# Patient Record
Sex: Female | Born: 1990 | Race: White | Hispanic: No | Marital: Single | Smoking: Never smoker
Health system: Southern US, Community
[De-identification: ages and names within clinical notes are randomized; demographics above are authoritative.]

## PROBLEM LIST (undated history)

## (undated) ENCOUNTER — Inpatient Hospital Stay: Payer: Self-pay

## (undated) DIAGNOSIS — N912 Amenorrhea, unspecified: Secondary | ICD-10-CM

## (undated) DIAGNOSIS — Z789 Other specified health status: Secondary | ICD-10-CM

## (undated) DIAGNOSIS — J45909 Unspecified asthma, uncomplicated: Secondary | ICD-10-CM

## (undated) HISTORY — DX: Amenorrhea, unspecified: N91.2

## (undated) HISTORY — PX: NO PAST SURGERIES: SHX2092

## (undated) HISTORY — DX: Unspecified asthma, uncomplicated: J45.909

---
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
## 2012-03-06 ENCOUNTER — Emergency Department: Payer: Self-pay | Admitting: Emergency Medicine

## 2012-03-23 ENCOUNTER — Emergency Department: Payer: Self-pay | Admitting: Emergency Medicine

## 2012-03-23 LAB — COMPREHENSIVE METABOLIC PANEL
Alkaline Phosphatase: 56 U/L — ABNORMAL LOW (ref 82–169)
Anion Gap: 12 (ref 7–16)
BUN: 9 mg/dL (ref 9–21)
Bilirubin,Total: 0.3 mg/dL (ref 0.2–1.0)
Calcium, Total: 9 mg/dL (ref 9.0–10.7)
Co2: 24 mmol/L (ref 16–25)
Creatinine: 0.53 mg/dL — ABNORMAL LOW (ref 0.60–1.30)
Osmolality: 289 (ref 275–301)
SGPT (ALT): 18 U/L
Sodium: 146 mmol/L — ABNORMAL HIGH (ref 132–141)

## 2012-03-23 LAB — CBC
HGB: 13.7 g/dL (ref 12.0–16.0)
MCH: 29.2 pg (ref 26.0–34.0)
MCHC: 33.8 g/dL (ref 32.0–36.0)
MCV: 86 fL (ref 80–100)
RBC: 4.7 10*6/uL (ref 3.80–5.20)
RDW: 12.6 % (ref 11.5–14.5)

## 2012-03-23 LAB — URINALYSIS, COMPLETE
Bilirubin,UR: NEGATIVE
Glucose,UR: NEGATIVE mg/dL (ref 0–75)
Ketone: NEGATIVE
Nitrite: NEGATIVE
Ph: 6 (ref 4.5–8.0)
Protein: 30
Squamous Epithelial: 4
WBC UR: 12 /HPF (ref 0–5)

## 2012-05-02 ENCOUNTER — Emergency Department: Payer: Self-pay | Admitting: *Deleted

## 2012-05-03 LAB — URINALYSIS, COMPLETE
Bacteria: NONE SEEN
Blood: NEGATIVE
Protein: 30
RBC,UR: 10 /HPF (ref 0–5)
Specific Gravity: 1.03 (ref 1.003–1.030)
Squamous Epithelial: 3
WBC UR: 28 /HPF (ref 0–5)

## 2012-05-03 LAB — PREGNANCY, URINE: Pregnancy Test, Urine: NEGATIVE m[IU]/mL

## 2012-05-24 ENCOUNTER — Ambulatory Visit: Payer: Self-pay | Admitting: Pediatrics

## 2013-03-25 ENCOUNTER — Emergency Department: Payer: Self-pay | Admitting: Emergency Medicine

## 2013-03-25 LAB — URINALYSIS, COMPLETE
Bilirubin,UR: NEGATIVE
Glucose,UR: NEGATIVE mg/dL (ref 0–75)
Protein: 30
Specific Gravity: 1.024 (ref 1.003–1.030)
WBC UR: 43 /HPF (ref 0–5)

## 2015-09-03 ENCOUNTER — Other Ambulatory Visit: Payer: Self-pay | Admitting: Family Medicine

## 2015-09-10 ENCOUNTER — Other Ambulatory Visit: Payer: Self-pay | Admitting: Family Medicine

## 2015-09-13 ENCOUNTER — Other Ambulatory Visit: Payer: Self-pay | Admitting: Family Medicine

## 2015-09-15 NOTE — Telephone Encounter (Signed)
Please contact patient and let her know that she has not been seen over 1 year and therefore I can not refill any medications for her until she follows up.

## 2015-09-15 NOTE — Telephone Encounter (Signed)
A message was left for this patient stating that no meds could be refilled until she comes in for a office visit since it has been over a year.

## 2015-10-14 LAB — HM PAP SMEAR: HM PAP: NORMAL

## 2015-11-27 ENCOUNTER — Encounter: Payer: Self-pay | Admitting: Emergency Medicine

## 2015-11-27 ENCOUNTER — Emergency Department
Admission: EM | Admit: 2015-11-27 | Discharge: 2015-11-27 | Disposition: A | Discharge status: Home / Self Care | Payer: Self-pay | Attending: Emergency Medicine | Admitting: Emergency Medicine

## 2015-11-27 DIAGNOSIS — Z3202 Encounter for pregnancy test, result negative: Secondary | ICD-10-CM | POA: Insufficient documentation

## 2015-11-27 DIAGNOSIS — N12 Tubulo-interstitial nephritis, not specified as acute or chronic: Secondary | ICD-10-CM | POA: Insufficient documentation

## 2015-11-27 LAB — CBC
HCT: 37.4 % (ref 35.0–47.0)
Hemoglobin: 12.2 g/dL (ref 12.0–16.0)
MCH: 28.8 pg (ref 26.0–34.0)
MCHC: 32.8 g/dL (ref 32.0–36.0)
MCV: 87.8 fL (ref 80.0–100.0)
PLATELETS: 207 10*3/uL (ref 150–440)
RBC: 4.26 MIL/uL (ref 3.80–5.20)
RDW: 12.2 % (ref 11.5–14.5)
WBC: 16.5 10*3/uL — ABNORMAL HIGH (ref 3.6–11.0)

## 2015-11-27 LAB — COMPREHENSIVE METABOLIC PANEL WITH GFR
ALT: 13 U/L — ABNORMAL LOW (ref 14–54)
AST: 12 U/L — ABNORMAL LOW (ref 15–41)
Albumin: 3.7 g/dL (ref 3.5–5.0)
Alkaline Phosphatase: 45 U/L (ref 38–126)
Anion gap: 8 (ref 5–15)
BUN: 11 mg/dL (ref 6–20)
CO2: 23 mmol/L (ref 22–32)
Calcium: 8.9 mg/dL (ref 8.9–10.3)
Chloride: 103 mmol/L (ref 101–111)
Creatinine, Ser: 0.53 mg/dL (ref 0.44–1.00)
GFR calc Af Amer: >60 mL/min
GFR calc non Af Amer: >60 mL/min
Glucose, Bld: 95 mg/dL (ref 65–99)
Potassium: 3.6 mmol/L (ref 3.5–5.1)
Sodium: 134 mmol/L — ABNORMAL LOW (ref 135–145)
Total Bilirubin: 0.8 mg/dL (ref 0.3–1.2)
Total Protein: 7 g/dL (ref 6.5–8.1)

## 2015-11-27 LAB — LIPASE, BLOOD: LIPASE: 19 U/L (ref 11–51)

## 2015-11-27 LAB — POCT PREGNANCY, URINE: Preg Test, Ur: NEGATIVE

## 2015-11-27 LAB — URINALYSIS COMPLETE WITH MICROSCOPIC (ARMC ONLY)
BILIRUBIN URINE: NEGATIVE
Glucose, UA: NEGATIVE mg/dL
KETONES UR: NEGATIVE mg/dL
Nitrite: NEGATIVE
Protein, ur: NEGATIVE mg/dL
Specific Gravity, Urine: 1.017 (ref 1.005–1.030)
pH: 5 (ref 5.0–8.0)

## 2015-11-27 MED ORDER — MORPHINE SULFATE (PF) 4 MG/ML IV SOLN
4.0000 mg | Freq: Once | INTRAVENOUS | Status: AC
  Administered 2015-11-27: 4 mg via INTRAVENOUS
  Filled 2015-11-27: qty 1

## 2015-11-27 MED ORDER — HYDROCODONE-ACETAMINOPHEN 5-325 MG PO TABS: 1.0000 | ORAL_TABLET | ORAL | Status: DC | PRN

## 2015-11-27 MED ORDER — PHENAZOPYRIDINE HCL 200 MG PO TABS: 200.0000 mg | ORAL_TABLET | Freq: Three times a day (TID) | ORAL | Status: AC | PRN

## 2015-11-27 MED ORDER — SODIUM CHLORIDE 0.9 % IV BOLUS (SEPSIS)
1000.0000 mL | Freq: Once | INTRAVENOUS | Status: AC
  Administered 2015-11-27: 1000 mL via INTRAVENOUS

## 2015-11-27 MED ORDER — LEVOFLOXACIN 500 MG PO TABS: 500.0000 mg | ORAL_TABLET | Freq: Every day | ORAL | Status: AC

## 2015-11-27 MED ORDER — ONDANSETRON HCL 4 MG/2ML IJ SOLN
4.0000 mg | Freq: Once | INTRAMUSCULAR | Status: AC
  Administered 2015-11-27: 4 mg via INTRAVENOUS
  Filled 2015-11-27: qty 2

## 2015-11-27 MED ORDER — DEXTROSE 5 % IV SOLN
1.0000 g | Freq: Once | INTRAVENOUS | Status: AC
  Administered 2015-11-27: 1 g via INTRAVENOUS
  Filled 2015-11-27: qty 10

## 2015-11-27 NOTE — Discharge Instructions (Signed)
Please take your prescribed antibiotics, pain medication as needed, and follow up with her primary care doctor in 2-3 days for recheck. Return to the emergency department if you continue to have fever, develop worsening abdominal pain, begin vomiting unable to keep down her antibiotics, or any other symptom personally concerning to yourself.    Pyelonephritis, Adult Pyelonephritis is a kidney infection. The kidneys are the organs that filter a person's blood and move waste out of the bloodstream and into the urine. Urine passes from the kidneys, through the ureters, and into the bladder. There are two main types of pyelonephritis:  Infections that come on quickly without any warning (acute pyelonephritis).  Infections that last for a long period of time (chronic pyelonephritis). In most cases, the infection clears up with treatment and does not cause further problems. More severe infections or chronic infections can sometimes spread to the bloodstream or lead to other problems with the kidneys. CAUSES This condition is usually caused by:  Bacteria traveling from the bladder to the kidney through infected urine. The urine in the bladder can become infected with bacteria from:  Bladder infection (cystitis).  Inflammation of the prostate gland (prostatitis).  Sexual intercourse, in females.  Bacteria traveling from the bloodstream to the kidney. RISK FACTORS This condition is more likely to develop in:  Pregnant women.  Older people.  People who have diabetes.  People who have kidney stones or bladder stones.  People who have other abnormalities of the kidney or ureter.  People who have a catheter placed in the bladder.  People who have cancer.  People who are sexually active.  Women who use spermicides.  People who have had a prior urinary tract infection. SYMPTOMS Symptoms of this condition include:  Frequent urination.  Strong or persistent urge to  urinate.  Burning or stinging when urinating.  Abdominal pain.  Back pain.  Pain in the side or flank area.  Fever.  Chills.  Blood in the urine, or dark urine.  Nausea.  Vomiting. DIAGNOSIS This condition may be diagnosed based on:  Medical history and physical exam.  Urine tests.  Blood tests. You may also have imaging tests of the kidneys, such as an ultrasound or CT scan. TREATMENT Treatment for this condition may depend on the severity of the infection.  If the infection is mild and is found early, you may be treated with antibiotic medicines taken by mouth. You will need to drink fluids to remain hydrated.  If the infection is more severe, you may need to stay in the hospital and receive antibiotics given directly into a vein through an IV tube. You may also need to receive fluids through an IV tube if you are not able to remain hydrated. After your hospital stay, you may need to take oral antibiotics for a period of time. Other treatments may be required, depending on the cause of the infection. HOME CARE INSTRUCTIONS Medicines  Take over-the-counter and prescription medicines only as told by your health care provider.  If you were prescribed an antibiotic medicine, take it as told by your health care provider. Do not stop taking the antibiotic even if you start to feel better. General Instructions  Drink enough fluid to keep your urine clear or pale yellow.  Avoid caffeine, tea, and carbonated beverages. They tend to irritate the bladder.  Urinate often. Avoid holding in urine for long periods of time.  Urinate before and after sex.  After a bowel movement, women should cleanse from  front to back. Use each tissue only once.  Keep all follow-up visits as told by your health care provider. This is important. SEEK MEDICAL CARE IF:  Your symptoms do not get better after 2 days of treatment.  Your symptoms get worse.  You have a fever. SEEK IMMEDIATE  MEDICAL CARE IF:  You are unable to take your antibiotics or fluids.  You have shaking chills.  You vomit.  You have severe flank or back pain.  You have extreme weakness or fainting.   This information is not intended to replace advice given to you by your health care provider. Make sure you discuss any questions you have with your health care provider.   Document Released: 11/29/2005 Document Revised: 08/20/2015 Document Reviewed: 03/24/2015 Elsevier Interactive Patient Education [employer]2016 [employer]

## 2015-11-27 NOTE — ED Provider Notes (Signed)
[facility] Emergency Department Provider Note  Time seen: 4:09 PM  I have reviewed the triage vital signs and the nursing notes.   HISTORY  Chief Complaint Flank Pain    HPI Unknown Lauren Mercer is a 24 y.o. female with no past medical history who presents the emergency department right flank pain. According to the patient for the past 2 days she has been experiencing right flank pain. Denies any fever, nausea, vomiting, diarrhea, dysuria, has noted some mild urinary frequency. Last menstrual period was approximately 2 weeks ago. States low-grade fevers, subjective. Describes her pain as an 8/10 currently, aching pain in the right flank.     History reviewed. No pertinent past medical history.  There are no active problems to display for this patient.   History reviewed. No pertinent past surgical history.  No current outpatient prescriptions on file.  Allergies Review of patient's allergies indicates no known allergies.  No family history on file.  Social History Social History  Substance Use Topics  . Smoking status: Never Smoker   . Smokeless tobacco: None  . Alcohol Use: No    Review of Systems Constitutional: Negative for fever. Cardiovascular: Negative for chest pain. Respiratory: Negative for shortness of breath. Gastrointestinal: Positive right flank pain.  Genitourinary: Negative for dysuria. Positive urinary frequency. Neurological: Negative for headache 10-point ROS otherwise negative.  ____________________________________________   PHYSICAL EXAM:  VITAL SIGNS: ED Triage Vitals  Enc Vitals Group     BP 11/27/15 1558 128/70 mmHg     Pulse Rate 11/27/15 1558 57     Resp 11/27/15 1558 18     Temp 11/27/15 1558 99.2 F (37.3 C)     Temp Source 11/27/15 1558 Oral     SpO2 11/27/15 1558 100 %     Weight 11/27/15 1558 155 lb (70.308 kg)     Height 11/27/15 1558  (1.626 m)     Head Cir --      Peak Flow --      Pain  Score 11/27/15 1555 10     Pain Loc --      Pain Edu? --      Excl. in GC? --     Constitutional: Alert and oriented. Well appearing and in no distress. Eyes: Normal exam ENT   Head: Normocephalic and atraumatic.   Mouth/Throat: Mucous membranes are moist. Cardiovascular: Normal rate, regular rhythm. No murmur Respiratory: Normal respiratory effort without tachypnea nor retractions. Breath sounds are clear Gastrointestinal: Soft, moderate right-sided abdominal tenderness palpation, upper and lower. No rebound or guarding. No distention. No CVA tenderness. Musculoskeletal: Nontender with normal range of motion in all extremities.  Neurologic:  Normal speech and language. No gross focal neurologic deficits  Skin:  Skin is warm, dry and intact.  Psychiatric: Mood and affect are normal. Speech and behavior are normal.   ____________________________________________      INITIAL IMPRESSION / ASSESSMENT AND PLAN / ED COURSE  Pertinent labs & imaging results that were available during my care of the patient were reviewed by me and considered in my medical decision making (see chart for details).  Patient with right flank/right side abdominal pain. Moderate tenderness to palpation over the same area. We will check labs, treat pain and nausea, IV hydrate while awaiting lab results. Overall well-appearing.  Labs that most consistent with pyelonephritis which fits her exam findings. We will treat with Rocephin in the emergency department, discharge and pain medication and antibiotics. Patient is agreeable to plan.  ____________________________________________   FINAL CLINICAL IMPRESSION(S) / ED DIAGNOSES  Right-sided abdominal pain Pyelonephritis  Minna AntisKevin Quitman Norberto, MD 11/27/15 [phone]

## 2015-11-27 NOTE — ED Notes (Signed)
C/o right flank pain that radiates to RUQ area since Tuesday, denies any urinary symptoms, has had some nausea

## 2015-11-28 ENCOUNTER — Other Ambulatory Visit: Payer: Self-pay | Admitting: Family Medicine

## 2015-11-30 LAB — URINE CULTURE: Culture: >=100000

## 2016-11-30 ENCOUNTER — Encounter: Payer: Self-pay | Admitting: Certified Nurse Midwife

## 2016-11-30 ENCOUNTER — Ambulatory Visit (INDEPENDENT_AMBULATORY_CARE_PROVIDER_SITE_OTHER): Payer: Medicaid Other | Admitting: Certified Nurse Midwife

## 2016-11-30 VITALS — BP 105/67 | HR 73 | Wt 164.5 lb

## 2016-11-30 DIAGNOSIS — Z3401 Encounter for supervision of normal first pregnancy, first trimester: Secondary | ICD-10-CM

## 2016-11-30 DIAGNOSIS — L309 Dermatitis, unspecified: Secondary | ICD-10-CM

## 2016-11-30 DIAGNOSIS — Z1389 Encounter for screening for other disorder: Secondary | ICD-10-CM

## 2016-11-30 DIAGNOSIS — N926 Irregular menstruation, unspecified: Secondary | ICD-10-CM

## 2016-11-30 DIAGNOSIS — Z113 Encounter for screening for infections with a predominantly sexual mode of transmission: Secondary | ICD-10-CM | POA: Diagnosis not present

## 2016-11-30 DIAGNOSIS — N912 Amenorrhea, unspecified: Secondary | ICD-10-CM

## 2016-11-30 DIAGNOSIS — G43001 Migraine without aura, not intractable, with status migrainosus: Secondary | ICD-10-CM

## 2016-11-30 DIAGNOSIS — J4520 Mild intermittent asthma, uncomplicated: Secondary | ICD-10-CM

## 2016-11-30 DIAGNOSIS — Z369 Encounter for antenatal screening, unspecified: Secondary | ICD-10-CM

## 2016-11-30 HISTORY — DX: Amenorrhea, unspecified: N91.2

## 2016-11-30 NOTE — Progress Notes (Addendum)
NEW OB HISTORY AND PHYSICAL  SUBJECTIVE:       Lauren Mercer is a 25 y.o. G1P0 female, Patient's last menstrual period was 08/23/2016 (approximate)., Estimated Date of Delivery: 05/30/17, [id number], presents today for establishment of Prenatal Care. She complains of nausea without vomiting and breast tenderness. Pregnancy was unplanned, but both pt and her partner are excited. Reports history of childhood asthma, last use of inhaler in "first grade".      Gynecologic History Patient's last menstrual period was 08/23/2016 (approximate). Abnormal; LMP date from HD based on last single period, but had two cycles in October 2017. Contraception: OCP (estrogen/progesterone) Last Pap: October 2016. Results were: normal  Obstetric History OB History  Gravida Para Term Preterm AB Living  1            SAB TAB Ectopic Multiple Live Births               # Outcome Date GA Lbr Len/2nd Weight Sex Delivery Anes PTL Lv  1 Current               Past Medical History:  Diagnosis Date  . Amenorrhea 11/30/2016  . Asthma     History reviewed. No pertinent surgical history.  No current outpatient prescriptions on file prior to visit.   No current facility-administered medications on file prior to visit.     No Known Allergies  Social History   Social History  . Marital status: Single    Spouse name: N/A  . Number of children: N/A  . Years of education: N/A   Occupational History  . Not on file.   Social History Main Topics  . Smoking status: Never Smoker  . Smokeless tobacco: Never Used  . Alcohol use No  . Drug use: No  . Sexual activity: Yes    Partners: Male    Birth control/ protection: None   Other Topics Concern  . Not on file   Social History Narrative  . No narrative on file    Family History  Problem Relation Age of Onset  . Diabetes Maternal Uncle     The following portions of the patient's history were reviewed and updated as appropriate: allergies, current  medications, past OB history, past medical history, past surgical history, past family history, past social history, and problem list.    OBJECTIVE: Initial Physical Exam (New OB)  GENERAL APPEARANCE: alert, well appearing, oriented to person, place and time HEAD: normocephalic, atraumatic MOUTH: dental hygiene good THYROID: no thyromegaly or masses present BREASTS: no masses noted, no significant tenderness, no palpable axillary nodes, no skin changes LUNGS: clear to auscultation, no wheezes, rales or rhonchi, symmetric air entry HEART: regular rate and rhythm, no murmurs ABDOMEN: soft, nontender, nondistended, no abnormal masses, no epigastric pain EXTREMITIES: no redness or tenderness in the calves or thighs SKIN: normal coloration and turgor, no rashes, seven professional tattoos  LYMPH NODES: not assessed NEUROLOGIC: alert, oriented, normal speech, no focal findings or movement disorder noted  PELVIC EXAM EXTERNAL GENITALIA: normal appearing vulva with no masses, tenderness or lesions UTERUS: S<D ADNEXA: no masses palpable and nontender  ASSESSMENT: Amenorrhea   PLAN: New OB visit converted to GYN visit [facility] US Labs See orders

## 2016-11-30 NOTE — Patient Instructions (Addendum)
Pregnancy and Zika Virus Disease Introduction Zika virus disease, or Zika, is an illness that can spread to people from mosquitoes that carry the virus. It may also spread from person to person through infected body fluids. Zika first occurred in [place], but recently it has spread to new areas. The virus occurs in tropical climates. The location of Zika continues to change. Most people who become infected with Zika virus do not develop serious illness. However, Zika may cause birth defects in an unborn baby whose mother is infected with the virus. It may also increase the risk of miscarriage. What are the symptoms of Zika virus disease? In many cases, people who have been infected with Zika virus do not develop any symptoms. If symptoms appear, they usually start about a week after the person is infected. Symptoms are usually mild. They may include:  Fever.  Rash.  Red eyes.  Joint pain. How does Zika virus disease spread? The main way that Zika virus spreads is through the bite of a certain type of mosquito. Unlike most types of mosquitos, which bite only at night, the type of mosquito that carries Zika virus bites both at night and during the day. Zika virus can also spread through sexual contact, through a blood transfusion, and from a mother to her baby before or during birth. Once you have had Zika virus disease, it is unlikely that you will get it again. Can I pass Zika to my baby during pregnancy? Yes, Zika can pass from a mother to her baby before or during birth. What problems can Zika cause for my baby? A woman who is infected with Zika virus while pregnant is at risk of having her baby born with a condition in which the brain or head is smaller than expected (microcephaly). Babies who have microcephaly can have developmental delays, seizures, hearing problems, and vision problems. Having Zika virus disease during pregnancy can also increase the risk of miscarriage. How can Zika  virus disease be prevented? There is no vaccine to prevent Zika. The best way to prevent the disease is to avoid infected mosquitoes and avoid exposure to body fluids that can spread the virus. Avoid any possible exposure to Zika by taking the following precautions. For women and their sex partners:  Avoid traveling to high-risk areas. The locations where Zika is being reported change often. To identify high-risk areas, check the CDC travel website: www.cdc.gov/zika/geo/index.html  If you or your sex partner must travel to a high-risk area, talk with a health care provider before and after traveling.  Take all precautions to avoid mosquito bites if you live in, or travel to, any of the high-risk areas. Insect repellents are safe to use during pregnancy.  Ask your health care provider when it is safe to have sexual contact. For women:  If you are pregnant or trying to become pregnant, avoid sexual contact with persons who may have been exposed to Zika virus, persons who have possible symptoms of Zika, or persons whose history you are unsure about. If you choose to have sexual contact with someone who may have been exposed to Zika virus, use condoms correctly during the entire duration of sexual activity, every time. Do not share sexual devices, as you may be exposed to body fluids.  Ask your health care provider about when it is safe to attempt pregnancy after a possible exposure to Zika virus. What steps should I take to avoid mosquito bites? Take these steps to avoid mosquito bites when you   are in a high-risk area:  Wear loose clothing that covers your arms and legs.  Limit your outdoor activities.  Do not open windows unless they have window screens.  Sleep under mosquito nets.  Use insect repellent. The best insect repellents have:  DEET, picaridin, oil of lemon eucalyptus (OLE), or [id number] in them.  Higher amounts of an active ingredient in them.  Remember that insect repellents  are safe to use during pregnancy.  Do not use OLE on children who are younger than 3 years of age. Do not use insect repellent on babies who are younger than 2 months of age.  Cover your child's stroller with mosquito netting. Make sure the netting fits snugly and that any loose netting does not cover your child's mouth or nose. Do not use a blanket as a mosquito-protection cover.  Do not apply insect repellent underneath clothing.  If you are using sunscreen, apply the sunscreen before applying the insect repellent.  Treat clothing with permethrin. Do not apply permethrin directly to your skin. Follow label directions for safe use.  Get rid of standing water, where mosquitoes may reproduce. Standing water is often found in items such as buckets, bowls, animal food dishes, and flowerpots. When you return from traveling to any high-risk area, continue taking actions to protect yourself against mosquito bites for 3 weeks, even if you show no signs of illness. This will prevent spreading Zika virus to uninfected mosquitoes. What should I know about the sexual transmission of Zika? People can spread Zika to their sexual partners during vaginal, anal, or oral sex, or by sharing sexual devices. Many people with Zika do not develop symptoms, so a person could spread the disease without knowing that they are infected. The greatest risk is to women who are pregnant or who may become pregnant. Zika virus can live longer in semen than it can live in blood. Couples can prevent sexual transmission of the virus by:  Using condoms correctly during the entire duration of sexual activity, every time. This includes vaginal, anal, and oral sex.  Not sharing sexual devices. Sharing increases your risk of being exposed to body fluid from another person.  Avoiding all sexual activity until your health care provider says it is safe. Should I be tested for Zika virus? A sample of your blood can be tested for Zika  virus. A pregnant woman should be tested if she may have been exposed to the virus or if she has symptoms of Zika. She may also have additional tests done during her pregnancy, such ultrasound testing. Talk with your health care provider about which tests are recommended. This information is not intended to replace advice given to you by your health care provider. Make sure you discuss any questions you have with your health care provider. Document Released: 08/20/2015 Document Revised: 05/06/2016 Document Reviewed: 08/13/2015  2017 Elsevier Minor Illnesses and Medications in Pregnancy  Cold/Flu:  Sudafed for congestion- Robitussin (plain) for cough- Tylenol for discomfort.  Please follow the directions on the label.  Try not to take any more than needed.  OTC Saline nasal spray and air humidifier or cool-mist  Vaporizer to sooth nasal irritation and to loosen congestion.  It is also important to increase intake of non carbonated fluids, especially if you have a fever.  Constipation:  Colace-2 capsules at bedtime; Metamucil- follow directions on label; Senokot- 1 tablet at bedtime.  Any one of these medications can be used.  It is also very important to increase   fluids and fruits along with regular exercise.  If problem persists please call the office.  Diarrhea:  Kaopectate as directed on the label.  Eat a bland diet and increase fluids.  Avoid highly seasoned foods.  Headache:  Tylenol 1 or 2 tablets every 3-4 hours as needed  Indigestion:  Maalox, Mylanta, Tums or Rolaids- as directed on label.  Also try to eat small meals and avoid fatty, greasy or spicy foods.  Nausea with or without Vomiting:  Nausea in pregnancy is caused by increased levels of hormones in the body which influence the digestive system and cause irritation when stomach acids accumulate.  Symptoms usually subside after 1st trimester of pregnancy.  Try the following: 1. Keep saltines, graham crackers or dry toast by your bed to  eat upon awakening. 2. Don't let your stomach get empty.  Try to eat 5-6 small meals per day instead of 3 large ones. 3. Avoid greasy fatty or highly seasoned foods.  4. Take OTC Unisom 1 tablet at bed time along with OTC Vitamin B6 25-50 mg 3 times per day.    If nausea continues with vomiting and you are unable to keep down food and fluids you may need a prescription medication.  Please notify your provider.   Sore throat:  Chloraseptic spray, throat lozenges and or plain Tylenol.  Vaginal Yeast Infection:  OTC Monistat for 7 days as directed on label.  If symptoms do not resolve within a week notify provider.  If any of the above problems do not subside with recommended treatment please call the office for further assistance.   Do not take Aspirin, Advil, Motrin or Ibuprofen.  * * OTC= Over the counter Hyperemesis Gravidarum Hyperemesis gravidarum is a severe form of nausea and vomiting that happens during pregnancy. Hyperemesis is worse than morning sickness. It may cause you to have nausea or vomiting all day for many days. It may keep you from eating and drinking enough food and liquids. Hyperemesis usually occurs during the first half (the first 20 weeks) of pregnancy. It often goes away once a woman is in her second half of pregnancy. However, sometimes hyperemesis continues through an entire pregnancy. What are the causes? The cause of this condition is not known. It may be related to changes in chemicals (hormones) in the body during pregnancy, such as the high level of pregnancy hormone (human chorionic gonadotropin) or the increase in the female sex hormone (estrogen). What are the signs or symptoms? Symptoms of this condition include:  Severe nausea and vomiting.  Nausea that does not go away.  Vomiting that does not allow you to keep any food down.  Weight loss.  Body fluid loss (dehydration).  Having no desire to eat, or not liking food that you have previously  enjoyed. How is this diagnosed? This condition may be diagnosed based on:  A physical exam.  Your medical history.  Your symptoms.  Blood tests.  Urine tests. How is this treated? This condition may be managed with medicine. If medicines to do not help relieve nausea and vomiting, you may need to receive fluids through an IV tube at the hospital. Follow these instructions at home:  Take over-the-counter and prescription medicines only as told by your health care provider.  Avoid iron pills and multivitamins that contain iron for the first 3-4 months of pregnancy. If you take prescription iron pills, do not stop taking them unless your health care provider approves.  Take the following actions to help   prevent nausea and vomiting:  In the morning, before getting out of bed, try eating a couple of dry crackers or a piece of toast.  Avoid foods and smells that upset your stomach. Fatty and spicy foods may make nausea worse.  Eat 5-6 small meals a day.  Do not drink fluids while eating meals. Drink between meals.  Eat or suck on things that have ginger in them. Ginger can help relieve nausea.  Avoid food preparation. The smell of food can spoil your appetite or trigger nausea.  Follow instructions from your health care provider about eating or drinking restrictions.  For snacks, eat high-protein foods, such as cheese.  Keep all follow-up and pre-birth (prenatal) visits as told by your health care provider. This is important. Contact a health care provider if:  You have pain in your abdomen.  You have a severe headache.  You have vision problems.  You are losing weight. Get help right away if:  You cannot drink fluids without vomiting.  You vomit blood.  You have constant nausea and vomiting.  You are very weak.  You are very thirsty.  You feel dizzy.  You faint.  You have a fever or other symptoms that last for more than 2-3 days.  You have a fever and  your symptoms suddenly get worse. Summary  Hyperemesis gravidarum is a severe form of nausea and vomiting that happens during pregnancy.  Making some changes to your eating habits may help relieve nausea and vomiting.  This condition may be managed with medicine.  If medicines to do not help relieve nausea and vomiting, you may need to receive fluids through an IV tube at the hospital. This information is not intended to replace advice given to you by your health care provider. Make sure you discuss any questions you have with your health care provider. Document Released: 11/29/2005 Document Revised: 07/28/2016 Document Reviewed: 07/28/2016 Elsevier Interactive Patient Education  2017 [employer] Commonly Asked Questions During Pregnancy  Cats: A parasite can be excreted in cat feces.  To avoid exposure you need to have another person empty the little box.  If you must empty the litter box you will need to wear gloves.  Wash your hands after handling your cat.  This parasite can also be found in raw or undercooked meat so this should also be avoided.  Colds, Sore Throats, Flu: Please check your medication sheet to see what you can take for symptoms.  If your symptoms are unrelieved by these medications please call the office.  Dental Work: Most any dental work your dentist recommends is permitted.  X-rays should only be taken during the first trimester if absolutely necessary.  Your abdomen should be shielded with a lead apron during all x-rays.  Please notify your provider prior to receiving any x-rays.  Novocaine is fine; gas is not recommended.  If your dentist requires a note from us prior to dental work please call the office and we will provide one for you.  Exercise: Exercise is an important part of staying healthy during your pregnancy.  You may continue most exercises you were accustomed to prior to pregnancy.  Later in your pregnancy you will most likely notice you have difficulty  with activities requiring balance like riding a bicycle.  It is important that you listen to your body and avoid activities that put you at a higher risk of falling.  Adequate rest and staying well hydrated are a must!  If you have questions   about the safety of specific activities ask your provider.    Exposure to Children with illness: Try to avoid obvious exposure; report any symptoms to us when noted,  If you have chicken pos, red measles or mumps, you should be immune to these diseases.   Please do not take any vaccines while pregnant unless you have checked with your OB provider.  Fetal Movement: After 28 weeks we recommend you do "kick counts" twice daily.  Lie or sit down in a calm quiet environment and count your baby movements "kicks".  You should feel your baby at least 10 times per hour.  If you have not felt 10 kicks within the first hour get up, walk around and have something sweet to eat or drink then repeat for an additional hour.  If count remains less than 10 per hour notify your provider.  Fumigating: Follow your pest control agent's advice as to how long to stay out of your home.  Ventilate the area well before re-entering.  Hemorrhoids:   Most over-the-counter preparations can be used during pregnancy.  Check your medication to see what is safe to use.  It is important to use a stool softener or fiber in your diet and to drink lots of liquids.  If hemorrhoids seem to be getting worse please call the office.   Hot Tubs:  Hot tubs Jacuzzis and saunas are not recommended while pregnant.  These increase your internal body temperature and should be avoided.  Intercourse:  Sexual intercourse is safe during pregnancy as long as you are comfortable, unless otherwise advised by your provider.  Spotting may occur after intercourse; report any bright red bleeding that is heavier than spotting.  Labor:  If you know that you are in labor, please go to the hospital.  If you are unsure, please  call the office and let us help you decide what to do.  Lifting, straining, etc:  If your job requires heavy lifting or straining please check with your provider for any limitations.  Generally, you should not lift items heavier than that you can lift simply with your hands and arms (no back muscles)  Painting:  Paint fumes do not harm your pregnancy, but may make you ill and should be avoided if possible.  Latex or water based paints have less odor than oils.  Use adequate ventilation while painting.  Permanents & Hair Color:  Chemicals in hair dyes are not recommended as they cause increase hair dryness which can increase hair loss during pregnancy.  " Highlighting" and permanents are allowed.  Dye may be absorbed differently and permanents may not hold as well during pregnancy.  Sunbathing:  Use a sunscreen, as skin burns easily during pregnancy.  Drink plenty of fluids; avoid over heating.  Tanning Beds:  Because their possible side effects are still unknown, tanning beds are not recommended.  Ultrasound Scans:  Routine ultrasounds are performed at approximately 20 weeks.  You will be able to see your baby's general anatomy an if you would like to know the gender this can usually be determined as well.  If it is questionable when you conceived you may also receive an ultrasound early in your pregnancy for dating purposes.  Otherwise ultrasound exams are not routinely performed unless there is a medical necessity.  Although you can request a scan we ask that you pay for it when conducted because insurance does not cover " patient request" scans.  Work: If your pregnancy proceeds without complications you   may work until your due date, unless your physician or employer advises otherwise.  Round Ligament Pain/Pelvic Discomfort:  Sharp, shooting pains not associated with bleeding are fairly common, usually occurring in the second trimester of pregnancy.  They tend to be worse when standing up or when  you remain standing for long periods of time.  These are the result of pressure of certain pelvic ligaments called "round ligaments".  Rest, Tylenol and heat seem to be the most effective relief.  As the womb and fetus grow, they rise out of the pelvis and the discomfort improves.  Please notify the office if your pain seems different than that described.  It may represent a more serious condition.   

## 2016-12-01 LAB — CBC WITH DIFFERENTIAL/PLATELET
BASOS: 1 %
Basophils Absolute: 0.1 10*3/uL (ref 0.0–0.2)
EOS (ABSOLUTE): 0.2 10*3/uL (ref 0.0–0.4)
EOS: 2 %
HEMATOCRIT: 36.7 % (ref 34.0–46.6)
HEMOGLOBIN: 12.4 g/dL (ref 11.1–15.9)
Immature Grans (Abs): 0 10*3/uL (ref 0.0–0.1)
Immature Granulocytes: 0 %
LYMPHS ABS: 2.2 10*3/uL (ref 0.7–3.1)
Lymphs: 21 %
MCH: 29 pg (ref 26.6–33.0)
MCHC: 33.8 g/dL (ref 31.5–35.7)
MCV: 86 fL (ref 79–97)
MONOCYTES: 6 %
Monocytes Absolute: 0.6 10*3/uL (ref 0.1–0.9)
Neutrophils Absolute: 7.3 10*3/uL — ABNORMAL HIGH (ref 1.4–7.0)
Neutrophils: 70 %
Platelets: 265 10*3/uL (ref 150–379)
RBC: 4.27 x10E6/uL (ref 3.77–5.28)
RDW: 12.7 % (ref 12.3–15.4)
WBC: 10.4 10*3/uL (ref 3.4–10.8)

## 2016-12-01 LAB — RH TYPE: Rh Factor: POSITIVE

## 2016-12-01 LAB — ABO

## 2016-12-01 LAB — RPR: RPR: NONREACTIVE

## 2016-12-01 LAB — RUBELLA SCREEN

## 2016-12-01 LAB — HEPATITIS B SURFACE ANTIGEN: Hepatitis B Surface Ag: NEGATIVE

## 2016-12-01 LAB — HIV ANTIBODY (ROUTINE TESTING W REFLEX): HIV SCREEN 4TH GENERATION: NONREACTIVE

## 2016-12-01 LAB — ANTIBODY SCREEN: Antibody Screen: NEGATIVE

## 2016-12-01 LAB — VARICELLA ZOSTER ANTIBODY, IGG

## 2016-12-01 LAB — BETA HCG QUANT (REF LAB): hCG Quant: 39357 m[IU]/mL

## 2016-12-02 LAB — MONITOR DRUG PROFILE 14(MW)
AMPHETAMINE SCREEN URINE: NEGATIVE ng/mL
BARBITURATE SCREEN URINE: NEGATIVE ng/mL
BENZODIAZEPINE SCREEN, URINE: NEGATIVE ng/mL
Buprenorphine, Urine: NEGATIVE ng/mL
CANNABINOIDS UR QL SCN: NEGATIVE ng/mL
COCAINE(METAB.)SCREEN, URINE: NEGATIVE ng/mL
CREATININE(CRT), U: 88.9 mg/dL (ref 20.0–300.0)
FENTANYL, URINE: NEGATIVE pg/mL
MEPERIDINE SCREEN, URINE: NEGATIVE ng/mL
Methadone Screen, Urine: NEGATIVE ng/mL
OPIATE SCREEN URINE: NEGATIVE ng/mL
OXYCODONE+OXYMORPHONE UR QL SCN: NEGATIVE ng/mL
Ph of Urine: 5.5 (ref 4.5–8.9)
Phencyclidine Qn, Ur: NEGATIVE ng/mL
Propoxyphene Scrn, Ur: NEGATIVE ng/mL
SPECIFIC GRAVITY: 1.03
TRAMADOL SCREEN, URINE: NEGATIVE ng/mL

## 2016-12-02 LAB — URINALYSIS, ROUTINE W REFLEX MICROSCOPIC
BILIRUBIN UA: NEGATIVE
GLUCOSE, UA: NEGATIVE
Nitrite, UA: NEGATIVE
PROTEIN UA: NEGATIVE
RBC UA: NEGATIVE
Specific Gravity, UA: 1.022 (ref 1.005–1.030)
UUROB: 0.2 mg/dL (ref 0.2–1.0)
pH, UA: 5.5 (ref 5.0–7.5)

## 2016-12-02 LAB — NICOTINE SCREEN, URINE: Cotinine Ql Scrn, Ur: NEGATIVE ng/mL

## 2016-12-02 LAB — MICROSCOPIC EXAMINATION: Casts: NONE SEEN /lpf

## 2016-12-02 LAB — GC/CHLAMYDIA PROBE AMP
Chlamydia trachomatis, NAA: NEGATIVE
Neisseria gonorrhoeae by PCR: NEGATIVE

## 2016-12-03 ENCOUNTER — Ambulatory Visit (INDEPENDENT_AMBULATORY_CARE_PROVIDER_SITE_OTHER): Payer: Medicaid Other

## 2016-12-03 DIAGNOSIS — Z369 Encounter for antenatal screening, unspecified: Secondary | ICD-10-CM

## 2016-12-03 DIAGNOSIS — Z3401 Encounter for supervision of normal first pregnancy, first trimester: Secondary | ICD-10-CM | POA: Diagnosis not present

## 2016-12-03 DIAGNOSIS — N926 Irregular menstruation, unspecified: Secondary | ICD-10-CM

## 2016-12-03 LAB — URINE CULTURE, OB REFLEX

## 2016-12-03 LAB — CULTURE, OB URINE

## 2016-12-13 NOTE — L&D Delivery Note (Signed)
Delivery Note At  1451 a viable and healthy female "Lauren Mercer" was delivered via  (Presentation:OA ;  ).  APGAR:8 ,9  .   Placenta status: delivered intact with 3 vessel  Cord:  with the following complications: none  Anesthesia:  epidural Episiotomy:  none Lacerations:  none Suture Repair: NA Est. Blood Loss (mL):  200  Mom to postpartum.  Baby to Couplet care / Skin to Skin.  Melody N Shambley 07/27/2017, 3:05 PM

## 2017-01-07 ENCOUNTER — Encounter: Payer: Self-pay | Admitting: Obstetrics and Gynecology

## 2017-01-07 ENCOUNTER — Ambulatory Visit (INDEPENDENT_AMBULATORY_CARE_PROVIDER_SITE_OTHER): Payer: Medicaid Other | Admitting: Obstetrics and Gynecology

## 2017-01-07 VITALS — BP 123/67 | HR 88 | Wt 161.5 lb

## 2017-01-07 DIAGNOSIS — Z2839 Other underimmunization status: Secondary | ICD-10-CM

## 2017-01-07 DIAGNOSIS — O9989 Other specified diseases and conditions complicating pregnancy, childbirth and the puerperium: Secondary | ICD-10-CM

## 2017-01-07 DIAGNOSIS — O09899 Supervision of other high risk pregnancies, unspecified trimester: Secondary | ICD-10-CM | POA: Insufficient documentation

## 2017-01-07 DIAGNOSIS — Z283 Underimmunization status: Secondary | ICD-10-CM

## 2017-01-07 DIAGNOSIS — Z3481 Encounter for supervision of other normal pregnancy, first trimester: Secondary | ICD-10-CM

## 2017-01-07 DIAGNOSIS — Z23 Encounter for immunization: Secondary | ICD-10-CM

## 2017-01-07 LAB — POCT URINALYSIS DIPSTICK
Glucose, UA: NEGATIVE
Ketones, UA: NEGATIVE
Leukocytes, UA: NEGATIVE
NITRITE UA: NEGATIVE
PH UA: 6
Protein, UA: NEGATIVE
RBC UA: NEGATIVE
SPEC GRAV UA: 1.015
UROBILINOGEN UA: 0.2

## 2017-01-07 NOTE — Progress Notes (Signed)
ROB- pt states she was living in a apartment and was exposed to carbon monoxide, states she is having a slight cough

## 2017-01-07 NOTE — Patient Instructions (Signed)
Second Trimester of Pregnancy The second trimester is from week 13 through week 28 (months 4 through 6). The second trimester is often a time when you feel your best. Your body has also adjusted to being pregnant, and you begin to feel better physically. Usually, morning sickness has lessened or quit completely, you may have more energy, and you may have an increase in appetite. The second trimester is also a time when the fetus is growing rapidly. At the end of the sixth month, the fetus is about 9 inches long and weighs about 1 pounds. You will likely begin to feel the baby move (quickening) between 18 and 20 weeks of the pregnancy. Body changes during your second trimester Your body continues to go through many changes during your second trimester. The changes vary from woman to woman.  Your weight will continue to increase. You will notice your lower abdomen bulging out.  You may begin to get stretch marks on your hips, abdomen, and breasts.  You may develop headaches that can be relieved by medicines. The medicines should be approved by your health care provider.  You may urinate more often because the fetus is pressing on your bladder.  You may develop or continue to have heartburn as a result of your pregnancy.  You may develop constipation because certain hormones are causing the muscles that push waste through your intestines to slow down.  You may develop hemorrhoids or swollen, bulging veins (varicose veins).  You may have back pain. This is caused by:  Weight gain.  Pregnancy hormones that are relaxing the joints in your pelvis.  A shift in weight and the muscles that support your balance.  Your breasts will continue to grow and they will continue to become tender.  Your gums may bleed and may be sensitive to brushing and flossing.  Dark spots or blotches (chloasma, mask of pregnancy) may develop on your face. This will likely fade after the baby is born.  A dark line  from your belly button to the pubic area (linea nigra) may appear. This will likely fade after the baby is born.  You may have changes in your hair. These can include thickening of your hair, rapid growth, and changes in texture. Some women also have hair loss during or after pregnancy, or hair that feels dry or thin. Your hair will most likely return to normal after your baby is born. What to expect at prenatal visits During a routine prenatal visit:  You will be weighed to make sure you and the fetus are growing normally.  Your blood pressure will be taken.  Your abdomen will be measured to track your baby's growth.  The fetal heartbeat will be listened to.  Any test results from the previous visit will be discussed. Your health care provider may ask you:  How you are feeling.  If you are feeling the baby move.  If you have had any abnormal symptoms, such as leaking fluid, bleeding, severe headaches, or abdominal cramping.  If you are using any tobacco products, including cigarettes, chewing tobacco, and electronic cigarettes.  If you have any questions. Other tests that may be performed during your second trimester include:  Blood tests that check for:  Low iron levels (anemia).  Gestational diabetes (between 24 and 28 weeks).  Rh antibodies. This is to check for a protein on red blood cells (Rh factor).  Urine tests to check for infections, diabetes, or protein in the urine.  An ultrasound to   confirm the proper growth and development of the baby.  An amniocentesis to check for possible genetic problems.  Fetal screens for spina bifida and Down syndrome.  HIV (human immunodeficiency virus) testing. Routine prenatal testing includes screening for HIV, unless you choose not to have this test. Follow these instructions at home: Eating and drinking  Continue to eat regular, healthy meals.  Avoid raw meat, uncooked cheese, cat litter boxes, and soil used by cats. These  carry germs that can cause birth defects in the baby.  Take your prenatal vitamins.  Take 1500-2000 mg of calcium daily starting at the 20th week of pregnancy until you deliver your baby.  If you develop constipation:  Take over-the-counter or prescription medicines.  Drink enough fluid to keep your urine clear or pale yellow.  Eat foods that are high in fiber, such as fresh fruits and vegetables, whole grains, and beans.  Limit foods that are high in fat and processed sugars, such as fried and sweet foods. Activity  Exercise only as directed by your health care provider. Experiencing uterine cramps is a good sign to stop exercising.  Avoid heavy lifting, wear low heel shoes, and practice good posture.  Wear your seat belt at all times when driving.  Rest with your legs elevated if you have leg cramps or low back pain.  Wear a good support bra for breast tenderness.  Do not use hot tubs, steam rooms, or saunas. Lifestyle  Avoid all smoking, herbs, alcohol, and unprescribed drugs. These chemicals affect the formation and growth of the baby.  Do not use any products that contain nicotine or tobacco, such as cigarettes and e-cigarettes. If you need help quitting, ask your health care provider.  A sexual relationship may be continued unless your health care provider directs you otherwise. General instructions  Follow your health care provider's instructions regarding medicine use. There are medicines that are either safe or unsafe to take during pregnancy.  Take warm sitz baths to soothe any pain or discomfort caused by hemorrhoids. Use hemorrhoid cream if your health care provider approves.  If you develop varicose veins, wear support hose. Elevate your feet for 15 minutes, 3-4 times a day. Limit salt in your diet.  Visit your dentist if you have not gone yet during your pregnancy. Use a soft toothbrush to brush your teeth and be gentle when you floss.  Keep all follow-up  prenatal visits as told by your health care provider. This is important. Contact a health care provider if:  You have dizziness.  You have mild pelvic cramps, pelvic pressure, or nagging pain in the abdominal area.  You have persistent nausea, vomiting, or diarrhea.  You have a bad smelling vaginal discharge.  You have pain with urination. Get help right away if:  You have a fever.  You are leaking fluid from your vagina.  You have spotting or bleeding from your vagina.  You have severe abdominal cramping or pain.  You have rapid weight gain or weight loss.  You have shortness of breath with chest pain.  You notice sudden or extreme swelling of your face, hands, ankles, feet, or legs.  You have not felt your baby move in over an hour.  You have severe headaches that do not go away with medicine.  You have vision changes. Summary  The second trimester is from week 13 through week 28 (months 4 through 6). It is also a time when the fetus is growing rapidly.  Your body goes   through many changes during pregnancy. The changes vary from woman to woman.  Avoid all smoking, herbs, alcohol, and unprescribed drugs. These chemicals affect the formation and growth your baby.  Do not use any tobacco products, such as cigarettes, chewing tobacco, and e-cigarettes. If you need help quitting, ask your health care provider.  Contact your health care provider if you have any questions. Keep all prenatal visits as told by your health care provider. This is important. This information is not intended to replace advice given to you by your health care provider. Make sure you discuss any questions you have with your health care provider. Document Released: 11/23/2001 Document Revised: 05/06/2016 Document Reviewed: 01/30/2013 Elsevier Interactive Patient Education  2017 [employer]  

## 2017-01-07 NOTE — Progress Notes (Signed)
NEW OB HISTORY AND PHYSICAL  SUBJECTIVE:       Lauren Mercer is a 26 y.o. G1P0 female, Patient's last menstrual period was 10/15/2016., Estimated Date of Delivery: 07/22/17, [id number], presents today for establishment of Prenatal Care. She has no unusual complaints and complains of nothing      Gynecologic History Patient's last menstrual period was 10/15/2016. Unknown Contraception: none Last Pap: 2016. Results were: normal  Obstetric History OB History  Gravida Para Term Preterm AB Living  1            SAB TAB Ectopic Multiple Live Births               # Outcome Date GA Lbr Len/2nd Weight Sex Delivery Anes PTL Lv  1 Current               Past Medical History:  Diagnosis Date  . Amenorrhea 11/30/2016  . Asthma     Past Surgical History:  Procedure Laterality Date  . NO PAST SURGERIES      Current Outpatient Prescriptions on File Prior to Visit  Medication Sig Dispense Refill  . Prenatal Vit-Fe Fumarate-FA (PRENATAL VITAMINS) 28-0.8 MG TABS Take 1 tablet by mouth daily.     No current facility-administered medications on file prior to visit.     No Known Allergies  Social History   Social History  . Marital status: Single    Spouse name: N/A  . Number of children: N/A  . Years of education: N/A   Occupational History  . Not on file.   Social History Main Topics  . Smoking status: Never Smoker  . Smokeless tobacco: Never Used  . Alcohol use No  . Drug use: No  . Sexual activity: Yes    Partners: Male    Birth control/ protection: None   Other Topics Concern  . Not on file   Social History Narrative  . No narrative on file    Family History  Problem Relation Age of Onset  . Diabetes Maternal Uncle     The following portions of the patient's history were reviewed and updated as appropriate: allergies, current medications, past OB history, past medical history, past surgical history, past family history, past social history, and problem  list.    OBJECTIVE: Initial Physical Exam (New OB)  GENERAL APPEARANCE: alert, well appearing, in no apparent distress, oriented to person, place and time HEAD: normocephalic, atraumatic MOUTH: mucous membranes moist, pharynx normal without lesions THYROID: not examined BREASTS: not examined LUNGS: not examined HEART: not examined ABDOMEN: soft, nontender, nondistended, no abnormal masses, no epigastric pain, fundus not palpable and FHT present EXTREMITIES: no redness or tenderness in the calves or thighs SKIN: normal coloration and turgor, no rashes LYMPH NODES: no adenopathy palpable NEUROLOGIC: alert, oriented, normal speech, no focal findings or movement disorder noted, not examined  PELVIC EXAM not indicated  ASSESSMENT: Normal pregnancy Flu vaccine needed  PLAN: Prenatal care First trimester screen obtained today Flu vaccine given  See orders

## 2017-01-10 ENCOUNTER — Telehealth: Payer: Self-pay | Admitting: Obstetrics and Gynecology

## 2017-01-10 NOTE — Telephone Encounter (Signed)
[facility] [address]

## 2017-01-12 NOTE — Telephone Encounter (Signed)
What inhaler was you going to send in???

## 2017-01-13 ENCOUNTER — Encounter: Payer: Self-pay | Admitting: Obstetrics and Gynecology

## 2017-01-18 LAB — MATERNIT 21 PLUS CORE, BLOOD
Chromosome 13: NEGATIVE
Chromosome 18: NEGATIVE
Chromosome 21: NEGATIVE
Y CHROMOSOME: NOT DETECTED

## 2017-01-18 LAB — CYSTIC FIBROSIS MUTATION 97: GENE DIS ANAL CARRIER INTERP BLD/T-IMP: NOT DETECTED

## 2017-01-19 ENCOUNTER — Other Ambulatory Visit: Payer: Self-pay | Admitting: Obstetrics and Gynecology

## 2017-01-19 MED ORDER — ALBUTEROL SULFATE HFA 108 (90 BASE) MCG/ACT IN AERS: 2.0000 | INHALATION_SPRAY | Freq: Four times a day (QID) | RESPIRATORY_TRACT | 2 refills | Status: DC | PRN

## 2017-02-01 ENCOUNTER — Ambulatory Visit (INDEPENDENT_AMBULATORY_CARE_PROVIDER_SITE_OTHER): Payer: Medicaid Other | Admitting: Obstetrics and Gynecology

## 2017-02-01 VITALS — BP 119/67 | HR 76 | Wt 162.7 lb

## 2017-02-01 DIAGNOSIS — Z3492 Encounter for supervision of normal pregnancy, unspecified, second trimester: Secondary | ICD-10-CM

## 2017-02-01 LAB — POCT URINALYSIS DIPSTICK
BILIRUBIN UA: NEGATIVE
Blood, UA: NEGATIVE
Glucose, UA: NEGATIVE
KETONES UA: NEGATIVE
LEUKOCYTES UA: NEGATIVE
Nitrite, UA: NEGATIVE
SPEC GRAV UA: 1.01
Urobilinogen, UA: 0.2
pH, UA: 7

## 2017-02-01 NOTE — Progress Notes (Signed)
ROB- pt is doing well, hasn't picked up her inhaler yet

## 2017-02-01 NOTE — Progress Notes (Signed)
ROB- doing better, reviewed results, anatomy scan next visit.

## 2017-02-25 ENCOUNTER — Other Ambulatory Visit: Payer: Self-pay | Admitting: Obstetrics and Gynecology

## 2017-02-25 DIAGNOSIS — Z369 Encounter for antenatal screening, unspecified: Secondary | ICD-10-CM

## 2017-03-02 ENCOUNTER — Ambulatory Visit (INDEPENDENT_AMBULATORY_CARE_PROVIDER_SITE_OTHER): Payer: Medicaid Other

## 2017-03-02 ENCOUNTER — Ambulatory Visit (INDEPENDENT_AMBULATORY_CARE_PROVIDER_SITE_OTHER): Payer: Medicaid Other | Admitting: Obstetrics and Gynecology

## 2017-03-02 VITALS — BP 104/68 | HR 82 | Wt 169.9 lb

## 2017-03-02 DIAGNOSIS — Z3492 Encounter for supervision of normal pregnancy, unspecified, second trimester: Secondary | ICD-10-CM

## 2017-03-02 DIAGNOSIS — Z369 Encounter for antenatal screening, unspecified: Secondary | ICD-10-CM

## 2017-03-02 LAB — POCT URINALYSIS DIPSTICK
BILIRUBIN UA: NEGATIVE
Glucose, UA: NEGATIVE
Ketones, UA: NEGATIVE
Leukocytes, UA: NEGATIVE
Nitrite, UA: NEGATIVE
Protein, UA: NEGATIVE
RBC UA: NEGATIVE
SPEC GRAV UA: 1.01 (ref 1.030–1.035)
UROBILINOGEN UA: 0.2 (ref ?–2.0)
pH, UA: 6 (ref 5.0–8.0)

## 2017-03-02 NOTE — Progress Notes (Signed)
ROB & anatomy scan:   Indications: Anatomy Findings:  Singleton intrauterine pregnancy is visualized with FHR at 147 BPM. Biometrics give an (U/S) Gestational age of [redacted] weeks and 6 days, and an (U/S) EDD of 07/21/17; this correlates with the clinically established EDD of 07/22/17.  Fetal presentation is breech, spine anterior.  EFW: 341 grams ( 0 lbs. 12 oz.). Placenta: Anterior, grade 1 and remote to cervix at 3.8 cm. AFI: Subjecively adequate with an MVP of 3.7 cm.  Anatomic survey is incomplete due to fetal position (breech, spine anterior). Anatomy seen today appears WNL. Anatomy needed to complete scan: profile, 5th digit, 4 chamber heart, RVOT, LVOT, AO/PA. Gender - Female.   Right Ovary measures 3.2 x 1.5 x 2.0 cm, and appears WNL. Left Ovary measures 2.9 x 1.9 x 2.0 cm, and appears WNL. There is no evidence of a corpus luteal cyst in either ovary. Survey of the adnexa demonstrates no adnexal masses. There is no free peritoneal fluid in the cul de sac.  Impression: 1. 19 week 6 day Viable Singleton Intrauterine pregnancy by U/S. 2. (U/S) EDD is consistent with Clinically established (LMP) EDD of 07/22/17. 3. Incomplete anatomy scan due to fetal position (breech, spine anterior).

## 2017-03-02 NOTE — Progress Notes (Signed)
ROB- anatomy scan done today, pt is doing well 

## 2017-03-07 ENCOUNTER — Telehealth: Payer: Self-pay

## 2017-03-07 DIAGNOSIS — Z20828 Contact with and (suspected) exposure to other viral communicable diseases: Secondary | ICD-10-CM

## 2017-03-07 MED ORDER — OSELTAMIVIR PHOSPHATE 75 MG PO CAPS: 75.0000 mg | ORAL_CAPSULE | Freq: Every day | ORAL | 0 refills | Status: DC

## 2017-03-07 NOTE — Telephone Encounter (Signed)
Pt calls and states that she has been dealing with flu like sx for 3 days. Pt states she has runny nose, headache, cough and believes she is feverish although she has not checked temp. 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 Advised pt on safe medications in pregnancy for cold/flu also sent list via mychart. Per M. Lawhorn rx sent for Tamiflu as flu prophylaxis. Pt advised pt call back if sx do not improve within 72hrs.

## 2017-03-13 ENCOUNTER — Other Ambulatory Visit: Payer: Self-pay | Admitting: Obstetrics and Gynecology

## 2017-03-13 DIAGNOSIS — Z0489 Encounter for examination and observation for other specified reasons: Secondary | ICD-10-CM

## 2017-03-13 DIAGNOSIS — Z3492 Encounter for supervision of normal pregnancy, unspecified, second trimester: Secondary | ICD-10-CM

## 2017-03-13 DIAGNOSIS — IMO0002 Reserved for concepts with insufficient information to code with codable children: Secondary | ICD-10-CM

## 2017-03-16 ENCOUNTER — Ambulatory Visit (INDEPENDENT_AMBULATORY_CARE_PROVIDER_SITE_OTHER): Payer: Medicaid Other

## 2017-03-16 DIAGNOSIS — IMO0002 Reserved for concepts with insufficient information to code with codable children: Secondary | ICD-10-CM

## 2017-03-16 DIAGNOSIS — Z3492 Encounter for supervision of normal pregnancy, unspecified, second trimester: Secondary | ICD-10-CM | POA: Diagnosis not present

## 2017-03-16 DIAGNOSIS — Z0489 Encounter for examination and observation for other specified reasons: Secondary | ICD-10-CM

## 2017-03-16 DIAGNOSIS — Z362 Encounter for other antenatal screening follow-up: Secondary | ICD-10-CM

## 2017-03-30 ENCOUNTER — Ambulatory Visit (INDEPENDENT_AMBULATORY_CARE_PROVIDER_SITE_OTHER): Payer: Medicaid Other | Admitting: Certified Nurse Midwife

## 2017-03-30 ENCOUNTER — Encounter: Payer: Self-pay | Admitting: Certified Nurse Midwife

## 2017-03-30 VITALS — BP 112/62 | HR 76 | Wt 178.0 lb

## 2017-03-30 DIAGNOSIS — Z3492 Encounter for supervision of normal pregnancy, unspecified, second trimester: Secondary | ICD-10-CM

## 2017-03-30 DIAGNOSIS — Z130 Encounter for screening for diseases of the blood and blood-forming organs and certain disorders involving the immune mechanism: Secondary | ICD-10-CM

## 2017-03-30 DIAGNOSIS — Z131 Encounter for screening for diabetes mellitus: Secondary | ICD-10-CM

## 2017-03-30 LAB — POCT URINALYSIS DIPSTICK
Bilirubin, UA: NEGATIVE
Blood, UA: NEGATIVE
GLUCOSE UA: NEGATIVE
Ketones, UA: NEGATIVE
Leukocytes, UA: NEGATIVE
Nitrite, UA: NEGATIVE
Protein, UA: NEGATIVE
SPEC GRAV UA: 1.02 (ref 1.010–1.025)
UROBILINOGEN UA: NEGATIVE U/dL — AB
pH, UA: 6 (ref 5.0–8.0)

## 2017-03-30 NOTE — Progress Notes (Signed)
ROB, doing well. No complaints. Denies lof, vag bleeding, and contractions. Reviewed glucola testing to be completed at next visit. ROB in 4 wks.   Doreene Burke, CNM

## 2017-03-30 NOTE — Patient Instructions (Signed)

## 2017-03-31 ENCOUNTER — Other Ambulatory Visit: Payer: Self-pay | Admitting: *Deleted

## 2017-03-31 ENCOUNTER — Encounter: Payer: Self-pay | Admitting: Obstetrics and Gynecology

## 2017-03-31 MED ORDER — CITRANATAL HARMONY 27-1-260 MG PO CAPS: 1.0000 | ORAL_CAPSULE | Freq: Every day | ORAL | 6 refills | Status: DC

## 2017-04-08 ENCOUNTER — Emergency Department
Admission: EM | Admit: 2017-04-08 | Discharge: 2017-04-08 | Disposition: A | Discharge status: Home / Self Care | Payer: Medicaid Other | Attending: Emergency Medicine | Admitting: Emergency Medicine

## 2017-04-08 DIAGNOSIS — R112 Nausea with vomiting, unspecified: Secondary | ICD-10-CM

## 2017-04-08 DIAGNOSIS — J45909 Unspecified asthma, uncomplicated: Secondary | ICD-10-CM | POA: Diagnosis not present

## 2017-04-08 DIAGNOSIS — Z3A25 25 weeks gestation of pregnancy: Secondary | ICD-10-CM | POA: Insufficient documentation

## 2017-04-08 DIAGNOSIS — O212 Late vomiting of pregnancy: Secondary | ICD-10-CM | POA: Diagnosis not present

## 2017-04-08 DIAGNOSIS — Z79899 Other long term (current) drug therapy: Secondary | ICD-10-CM | POA: Insufficient documentation

## 2017-04-08 MED ORDER — RANITIDINE HCL 75 MG PO TABS: 75.0000 mg | ORAL_TABLET | Freq: Two times a day (BID) | ORAL | 1 refills | Status: DC

## 2017-04-08 MED ORDER — ONDANSETRON 4 MG PO TBDP
4.0000 mg | ORAL_TABLET | Freq: Once | ORAL | Status: AC
  Administered 2017-04-08: 4 mg via ORAL
  Filled 2017-04-08: qty 1

## 2017-04-08 MED ORDER — ONDANSETRON 4 MG PO TBDP: 4.0000 mg | ORAL_TABLET | Freq: Three times a day (TID) | ORAL | 0 refills | Status: DC | PRN

## 2017-04-08 NOTE — ED Provider Notes (Signed)
[facility] Emergency Department Provider Note       Time seen: ----------------------------------------- 9:40 PM on 04/08/2017 -----------------------------------------     I have reviewed the triage vital signs and the nursing notes.   HISTORY   Chief Complaint Emesis    HPI Lauren Mercer is a 26 y.o. female who presents to the ED for vomiting started 30 mins prior to arrival. Patient states she's [redacted] weeks pregnant. She denies abdominal pain, chest pain or difficulty breathing. She denies any vaginal bleeding or discharge. She is G1 P0. Patient reports been working all day and just wants it checked out. She reports regular prenatal care and no change in fetal activity.   Past Medical History:  Diagnosis Date  . Amenorrhea 11/30/2016  . Asthma     Patient Active Problem List   Diagnosis Date Noted  . Rubella non-immune status, antepartum 01/07/2017  . Amenorrhea 11/30/2016  . Supervision of normal first pregnancy in first trimester 11/30/2016    Past Surgical History:  Procedure Laterality Date  . NO PAST SURGERIES      Allergies Patient has no known allergies.  Social History Social History  Substance Use Topics  . Smoking status: Never Smoker  . Smokeless tobacco: Never Used  . Alcohol use No    Review of Systems Constitutional: Negative for fever. Eyes: Negative for vision changes ENT:  Negative for congestion, sore throat Cardiovascular: Negative for chest pain. Respiratory: Negative for shortness of breath. Gastrointestinal: Negative for abdominal pain, Positive for vomiting Genitourinary: Negative for dysuria. Musculoskeletal: Negative for back pain. Skin: Negative for rash. Neurological: Negative for headaches, focal weakness or numbness.  All systems negative/normal/unremarkable except as stated in the HPI  ____________________________________________   PHYSICAL EXAM:  VITAL SIGNS: ED Triage Vitals  [04/08/17 2010]  Enc Vitals Group     BP (!) 129/59     Pulse Rate 90     Resp 18     Temp 98.3 F (36.8 C)     Temp Source Oral     SpO2 95 %     Weight 169 lb (76.7 kg)     Height  (1.6 m)     Head Circumference      Peak Flow      Pain Score      Pain Loc      Pain Edu?      Excl. in GC?     Constitutional: Alert and oriented. Well appearing and in no distress. Eyes: Conjunctivae are normal. PERRL. Normal extraocular movements. ENT   Head: Normocephalic and atraumatic.   Nose: No congestion/rhinnorhea.   Mouth/Throat: Mucous membranes are moist.   Neck: No stridor. Cardiovascular: Normal rate, regular rhythm. No murmurs, rubs, or gallops. Respiratory: Normal respiratory effort without tachypnea nor retractions. Breath sounds are clear and equal bilaterally. No wheezes/rales/rhonchi. Gastrointestinal: Soft and nontender. Normal bowel sounds. Gravid uterus, nontender Musculoskeletal: Nontender with normal range of motion in extremities. No lower extremity tenderness nor edema. Neurologic:  Normal speech and language. No gross focal neurologic deficits are appreciated.  Skin:  Skin is warm, dry and intact. No rash noted. Psychiatric: Mood and affect are normal. Speech and behavior are normal.  ____________________________________________  ED COURSE:  Pertinent labs & imaging results that were available during my care of the patient were reviewed by me and considered in my medical decision making (see chart for details). Patient presents for vomiting, we will provide bedside ultrasound examination and give oral Zofran.  Procedures ____________________________________________  FINAL ASSESSMENT AND PLAN  Vomiting  Plan: Patient had presented for vomiting but has a benign examination. Bedside ultrasound reveals normal fetal activity and cardiac activity with a heart rate of around 150. She may also be having some reflux symptoms while we'll prescribe  Zantac to take as needed. She is stable for outpatient follow-up.   Emily Filbert, MD   Note: This note was generated in part or whole with voice recognition software. Voice recognition is usually quite accurate but there are transcription errors that can and very often do occur. I apologize for any typographical errors that were not detected and corrected.     Emily Filbert, MD 04/08/17 2142

## 2017-04-08 NOTE — ED Triage Notes (Signed)
Pt presents to ED via POV with c/o emesis x2 that started 30 mins PTA. Pt is currently [redacted] weeks pregnant. Pt denies any c/o pain, denies chest pain or shortness of breath. Pt states she's been "working all day and just wants to get checked out". Pt reports regular prenatal care, no change in fetal activity. Pt denies vaginal bleeding or d/c.

## 2017-04-08 NOTE — ED Notes (Signed)
Pt discharged to home.  Family member driving.  Discharge instructions reviewed.  Verbalized understanding.  No questions or concerns at this time.  Teach back verified.  Pt in NAD.  No items left in ED.   

## 2017-04-08 NOTE — ED Notes (Signed)
Spoke with Dr Scotty Court regarding pt's medical condition/history, vital signs, and presenting c/o. Per Dr Johny Shears no labs or other Triage protocols needed at this time.

## 2017-04-27 ENCOUNTER — Encounter: Payer: Self-pay | Admitting: Certified Nurse Midwife

## 2017-04-27 ENCOUNTER — Ambulatory Visit (INDEPENDENT_AMBULATORY_CARE_PROVIDER_SITE_OTHER): Payer: Medicaid Other | Admitting: Certified Nurse Midwife

## 2017-04-27 VITALS — BP 98/63 | HR 77 | Wt 182.6 lb

## 2017-04-27 DIAGNOSIS — Z130 Encounter for screening for diseases of the blood and blood-forming organs and certain disorders involving the immune mechanism: Secondary | ICD-10-CM

## 2017-04-27 DIAGNOSIS — Z131 Encounter for screening for diabetes mellitus: Secondary | ICD-10-CM

## 2017-04-27 DIAGNOSIS — Z3492 Encounter for supervision of normal pregnancy, unspecified, second trimester: Secondary | ICD-10-CM | POA: Diagnosis not present

## 2017-04-27 DIAGNOSIS — Z23 Encounter for immunization: Secondary | ICD-10-CM

## 2017-04-27 LAB — POCT URINALYSIS DIPSTICK
Bilirubin, UA: NEGATIVE
Blood, UA: NEGATIVE
Glucose, UA: NEGATIVE
Ketones, UA: NEGATIVE
Leukocytes, UA: NEGATIVE
Nitrite, UA: NEGATIVE
Protein, UA: NEGATIVE
Spec Grav, UA: 1.015 (ref 1.010–1.025)
Urobilinogen, UA: 0.2 E.U./dL
pH, UA: 6 (ref 5.0–8.0)

## 2017-04-27 NOTE — Progress Notes (Signed)
ROB, doing well. T dap, blood consent , CBC, and GTT today. Discussed fetal movement and well being. Denies LOF, bleeding and contractions. Follow up in 2 wks.   Doreene BurkeAnnie Rondo Spittler, CNM

## 2017-04-27 NOTE — Patient Instructions (Addendum)
Glucose Tolerance Test During Pregnancy The glucose tolerance test (GTT) is a blood test used to determine if you have developed a type of diabetes during pregnancy (gestational diabetes). This is when your body does not properly process sugar (glucose) in the food you eat, resulting in high blood glucose levels. Typically, a GTT is done after you have had a 1-hour glucose test with results that indicate you possibly have gestational diabetes. It may also be done if:  You have a history of giving birth to very large babies or have experienced repeated fetal loss (stillbirth).  You have signs and symptoms of diabetes, such as: ? Changes in your vision. ? Tingling or numbness in your hands or feet. ? Changes in hunger, thirst, and urination not otherwise explained by your pregnancy.  The GTT lasts about 3 hours. You will be given a sugar-water solution to drink at the beginning of the test. You will have blood drawn before you drink the solution and then again 1, 2, and 3 hours after you drink it. You will not be allowed to eat or drink anything else during the test. You must remain at the testing location to make sure that your blood is drawn on time. You should also avoid exercising during the test, because exercise can alter test results. How do I prepare for this test? Eat normally for 3 days prior to the GTT test, including having plenty of carbohydrate-rich foods. Do not eat or drink anything except water during the final 12 hours before the test. In addition, your health care provider may ask you to stop taking certain medicines before the test. What do the results mean? It is your responsibility to obtain your test results. Ask the lab or department performing the test when and how you will get your results. Contact your health care provider to discuss any questions you have about your results. Range of Normal Values Ranges for normal values may vary among different labs and hospitals. You  should always check with your health care provider after having lab work or other tests done to discuss whether your values are considered within normal limits. Normal levels of blood glucose are as follows:  Fasting: less than 105 mg/dL.  1 hour after drinking the solution: less than 190 mg/dL.  2 hours after drinking the solution: less than 165 mg/dL.  3 hours after drinking the solution: less than 145 mg/dL.  Some substances can interfere with GTT results. These may include:  Blood pressure and heart failure medicines, including beta blockers, furosemide, and thiazides.  Anti-inflammatory medicines, including aspirin.  Nicotine.  Some psychiatric medicines.  Meaning of Results Outside Normal Value Ranges GTT test results that are above normal values may indicate a number of health problems, such as:  Gestational diabetes.  Acute stress response.  Cushing syndrome.  Tumors such as pheochromocytoma or glucagonoma.  Long-term kidney problems.  Pancreatitis.  Hyperthyroidism.  Current infection.  Discuss your test results with your health care provider. He or she will use the results to make a diagnosis and determine a treatment plan that is right for you. This information is not intended to replace advice given to you by your health care provider. Make sure you discuss any questions you have with your health care provider. Document Released: 05/30/2012 Document Revised: 05/06/2016 Document Reviewed: 04/05/2014 Elsevier Interactive Patient Education  2017 [employer] Tdap Vaccine (Tetanus, Diphtheria and Pertussis): What You Need to Know 1. Why get vaccinated? Tetanus, diphtheria and pertussis are very   serious diseases. Tdap vaccine can protect us from these diseases. And, Tdap vaccine given to pregnant women can protect newborn babies against pertussis. TETANUS (Lockjaw) is rare in the [place] today. It causes painful muscle tightening and stiffness, usually  all over the body.  It can lead to tightening of muscles in the head and neck so you can't open your mouth, swallow, or sometimes even breathe. Tetanus kills about 1 out of 10 people who are infected even after receiving the best medical care.  DIPHTHERIA is also rare in the [place] today. It can cause a thick coating to form in the back of the throat.  It can lead to breathing problems, heart failure, paralysis, and death.  PERTUSSIS (Whooping Cough) causes severe coughing spells, which can cause difficulty breathing, vomiting and disturbed sleep.  It can also lead to weight loss, incontinence, and rib fractures. Up to 2 in 100 adolescents and 5 in 100 adults with pertussis are hospitalized or have complications, which could include pneumonia or death.  These diseases are caused by bacteria. Diphtheria and pertussis are spread from person to person through secretions from coughing or sneezing. Tetanus enters the body through cuts, scratches, or wounds. Before vaccines, as many as 200,000 cases of diphtheria, 200,000 cases of pertussis, and hundreds of cases of tetanus, were reported in the [place] each year. Since vaccination began, reports of cases for tetanus and diphtheria have dropped by about 99% and for pertussis by about 80%. 2. Tdap vaccine Tdap vaccine can protect adolescents and adults from tetanus, diphtheria, and pertussis. One dose of Tdap is routinely given at age 11 or 12. People who did not get Tdap at that age should get it as soon as possible. Tdap is especially important for healthcare professionals and anyone having close contact with a baby younger than 12 months. Pregnant women should get a dose of Tdap during every pregnancy, to protect the newborn from pertussis. Infants are most at risk for severe, life-threatening complications from pertussis. Another vaccine, called Td, protects against tetanus and diphtheria, but not pertussis. A Td booster should be given  every 10 years. Tdap may be given as one of these boosters if you have never gotten Tdap before. Tdap may also be given after a severe cut or burn to prevent tetanus infection. Your doctor or the person giving you the vaccine can give you more information. Tdap may safely be given at the same time as other vaccines. 3. Some people should not get this vaccine  A person who has ever had a life-threatening allergic reaction after a previous dose of any diphtheria, tetanus or pertussis containing vaccine, OR has a severe allergy to any part of this vaccine, should not get Tdap vaccine. Tell the person giving the vaccine about any severe allergies.  Anyone who had coma or long repeated seizures within 7 days after a childhood dose of DTP or DTaP, or a previous dose of Tdap, should not get Tdap, unless a cause other than the vaccine was found. They can still get Td.  Talk to your doctor if you: ? have seizures or another nervous system problem, ? had severe pain or swelling after any vaccine containing diphtheria, tetanus or pertussis, ? ever had a condition called Guillain-Barr Syndrome (GBS), ? aren't feeling well on the day the shot is scheduled. 4. Risks With any medicine, including vaccines, there is a chance of side effects. These are usually mild and go away on their own. Serious reactions   are also possible but are rare. Most people who get Tdap vaccine do not have any problems with it. Mild problems following Tdap: (Did not interfere with activities)  Pain where the shot was given (about 3 in 4 adolescents or 2 in 3 adults)  Redness or swelling where the shot was given (about 1 person in 5)  Mild fever of at least 100.4F (up to about 1 in 25 adolescents or 1 in 100 adults)  Headache (about 3 or 4 people in 10)  Tiredness (about 1 person in 3 or 4)  Nausea, vomiting, diarrhea, stomach ache (up to 1 in 4 adolescents or 1 in 10 adults)  Chills, sore joints (about 1 person in  10)  Body aches (about 1 person in 3 or 4)  Rash, swollen glands (uncommon)  Moderate problems following Tdap: (Interfered with activities, but did not require medical attention)  Pain where the shot was given (up to 1 in 5 or 6)  Redness or swelling where the shot was given (up to about 1 in 16 adolescents or 1 in 12 adults)  Fever over 102F (about 1 in 100 adolescents or 1 in 250 adults)  Headache (about 1 in 7 adolescents or 1 in 10 adults)  Nausea, vomiting, diarrhea, stomach ache (up to 1 or 3 people in 100)  Swelling of the entire arm where the shot was given (up to about 1 in 500).  Severe problems following Tdap: (Unable to perform usual activities; required medical attention)  Swelling, severe pain, bleeding and redness in the arm where the shot was given (rare).  Problems that could happen after any vaccine:  People sometimes faint after a medical procedure, including vaccination. Sitting or lying down for about 15 minutes can help prevent fainting, and injuries caused by a fall. Tell your doctor if you feel dizzy, or have vision changes or ringing in the ears.  Some people get severe pain in the shoulder and have difficulty moving the arm where a shot was given. This happens very rarely.  Any medication can cause a severe allergic reaction. Such reactions from a vaccine are very rare, estimated at fewer than 1 in a million doses, and would happen within a few minutes to a few hours after the vaccination. As with any medicine, there is a very remote chance of a vaccine causing a serious injury or death. The safety of vaccines is always being monitored. For more information, visit: www.cdc.gov/vaccinesafety/ 5. What if there is a serious problem? What should I look for? Look for anything that concerns you, such as signs of a severe allergic reaction, very high fever, or unusual behavior. Signs of a severe allergic reaction can include hives, swelling of the face and  throat, difficulty breathing, a fast heartbeat, dizziness, and weakness. These would usually start a few minutes to a few hours after the vaccination. What should I do?  If you think it is a severe allergic reaction or other emergency that can't wait, call 9-1-1 or get the person to the nearest hospital. Otherwise, call your doctor.  Afterward, the reaction should be reported to the Vaccine Adverse Event Reporting System (VAERS). Your doctor might file this report, or you can do it yourself through the VAERS web site at www.vaers.hhs.gov, or by calling 1-800-822-7967. ? VAERS does not give medical advice. 6. The National Vaccine Injury Compensation Program The National Vaccine Injury Compensation Program (VICP) is a federal program that was created to compensate people who may have been injured   by certain vaccines. Persons who believe they may have been injured by a vaccine can learn about the program and about filing a claim by calling 1-800-338-2382 or visiting the VICP website at www.hrsa.gov/vaccinecompensation. There is a time limit to file a claim for compensation. 7. How can I learn more?  Ask your doctor. He or she can give you the vaccine package insert or suggest other sources of information.  Call your local or state health department.  Contact the Centers for Disease Control and Prevention (CDC): ? Call 1-800-232-4636 (1-800-CDC-INFO) or ? Visit CDC's website at www.cdc.gov/vaccines CDC Tdap Vaccine VIS (02/05/14) This information is not intended to replace advice given to you by your health care provider. Make sure you discuss any questions you have with your health care provider. Document Released: 05/30/2012 Document Revised: 08/19/2016 Document Reviewed: 08/19/2016 Elsevier Interactive Patient Education  2017 [employer] Inc[employer]  

## 2017-04-28 ENCOUNTER — Encounter: Payer: Self-pay | Admitting: Certified Nurse Midwife

## 2017-04-28 LAB — HEMOGLOBIN AND HEMATOCRIT, BLOOD
Hematocrit: 33.8 % — ABNORMAL LOW (ref 34.0–46.6)
Hemoglobin: 11.1 g/dL (ref 11.1–15.9)

## 2017-04-28 LAB — GLUCOSE, 1 HOUR GESTATIONAL: Gestational Diabetes Screen: 79 mg/dL (ref 65–139)

## 2017-05-05 ENCOUNTER — Ambulatory Visit (INDEPENDENT_AMBULATORY_CARE_PROVIDER_SITE_OTHER): Payer: Medicaid Other | Admitting: Obstetrics and Gynecology

## 2017-05-05 ENCOUNTER — Encounter: Payer: Self-pay | Admitting: Obstetrics and Gynecology

## 2017-05-05 VITALS — BP 111/69 | HR 73 | Wt 185.6 lb

## 2017-05-05 DIAGNOSIS — R109 Unspecified abdominal pain: Secondary | ICD-10-CM

## 2017-05-05 DIAGNOSIS — Z3493 Encounter for supervision of normal pregnancy, unspecified, third trimester: Secondary | ICD-10-CM

## 2017-05-05 LAB — POCT URINALYSIS DIPSTICK
BILIRUBIN UA: NEGATIVE
Blood, UA: NEGATIVE
GLUCOSE UA: NEGATIVE
KETONES UA: NEGATIVE
NITRITE UA: NEGATIVE
Protein, UA: NEGATIVE
Spec Grav, UA: 1.01 (ref 1.010–1.025)
Urobilinogen, UA: 0.2 E.U./dL
pH, UA: 7 (ref 5.0–8.0)

## 2017-05-05 MED ORDER — CEFIXIME 400 MG PO CAPS: 400.0000 mg | ORAL_CAPSULE | Freq: Every day | ORAL | 1 refills | Status: DC

## 2017-05-05 NOTE — Progress Notes (Signed)
OB WORK IN- pt has been having pain in her lower back x 3 days

## 2017-05-05 NOTE — Progress Notes (Signed)
Work in [employer]- rports onset of right lower pelvic pain and burning with urination 3 days ago, getting a little worse each day, also notes pain with sex at bladder stem.  Urine sent for culture;  Microscopic wet-mount exam shows negative for pathogens, normal epithelial cells.

## 2017-05-07 LAB — URINE CULTURE

## 2017-05-13 ENCOUNTER — Encounter: Payer: Self-pay | Admitting: Certified Nurse Midwife

## 2017-05-13 ENCOUNTER — Ambulatory Visit (INDEPENDENT_AMBULATORY_CARE_PROVIDER_SITE_OTHER): Payer: Medicaid Other | Admitting: Certified Nurse Midwife

## 2017-05-13 VITALS — BP 106/62 | HR 79 | Wt 188.9 lb

## 2017-05-13 DIAGNOSIS — Z3493 Encounter for supervision of normal pregnancy, unspecified, third trimester: Secondary | ICD-10-CM

## 2017-05-13 LAB — POCT URINALYSIS DIPSTICK
BILIRUBIN UA: NEGATIVE
Blood, UA: NEGATIVE
Glucose, UA: NEGATIVE
KETONES UA: NEGATIVE
LEUKOCYTES UA: NEGATIVE
Nitrite, UA: NEGATIVE
PH UA: 7 (ref 5.0–8.0)
Protein, UA: NEGATIVE
SPEC GRAV UA: 1.015 (ref 1.010–1.025)
Urobilinogen, UA: 0.2 E.U./dL

## 2017-05-13 NOTE — Patient Instructions (Signed)

## 2017-05-13 NOTE — Progress Notes (Signed)
ROB doing well. No complaints. Reviewed weight gain. Encouraged low fat diet with fruits/vegtables and lean protein. Encouraged exercise 3-5 times a week for 20-30 min. Above her normal daily activity. She agrees to plan. Discussed cord blood donation and birth classes. Follow up in 2 wks.   Doreene BurkeAnnie Tamel Abel, CNM

## 2017-05-13 NOTE — Progress Notes (Signed)
ROB- no complaints.  

## 2017-05-30 ENCOUNTER — Ambulatory Visit (INDEPENDENT_AMBULATORY_CARE_PROVIDER_SITE_OTHER): Payer: Medicaid Other | Admitting: Certified Nurse Midwife

## 2017-05-30 VITALS — BP 122/68 | HR 86 | Wt 192.7 lb

## 2017-05-30 DIAGNOSIS — Z3403 Encounter for supervision of normal first pregnancy, third trimester: Secondary | ICD-10-CM

## 2017-05-30 LAB — POCT URINALYSIS DIPSTICK
Bilirubin, UA: NEGATIVE
Glucose, UA: NEGATIVE
Ketones, UA: NEGATIVE
Leukocytes, UA: NEGATIVE
Nitrite, UA: NEGATIVE
PH UA: 6 (ref 5.0–8.0)
PROTEIN UA: NEGATIVE
RBC UA: NEGATIVE
UROBILINOGEN UA: 0.2 U/dL

## 2017-05-30 NOTE — Progress Notes (Signed)
ROB-Pt doing well, no questions or concerns. She is working two (2) jobs for a total of 50 hours weekly, but does not wish to decrease hours at this time. Each job provides water breaks, bathroom breaks, and rest as needed. Encouraged use of compression stockings during long work hours. Reviewed weight gain in pregnancy, reiterated need for only 300 additional daily calories and examples given. Pt states she does walk after work most days and praise given. Discussed the effects of antepartum weight gain during the intrapartum and postpartum period, pt verbalized understanding. Advised to bring FMLA paperwork to next visit for provider to complete. Reviewed red flag symptoms and when to call. RTC x 2 weeks for ROB.

## 2017-05-30 NOTE — Patient Instructions (Addendum)
Exercise During Pregnancy For people of all ages, exercise is an important part of being healthy. Exercise improves heart and lung function and helps to maintain strength, flexibility, and a healthy body weight. Exercise also boosts energy levels and elevates mood. For most women, maintaining an exercise routine throughout pregnancy is recommended. It is only on rare occasions and with certain medical conditions or pregnancy complications that women may be asked to limit or avoid exercise during pregnancy. What are some other benefits to exercising during pregnancy? Along with maintaining strength and flexibility, exercising throughout pregnancy can help to:  Keep strength in muscles that are very important during labor and childbirth.  Decrease low back pain during pregnancy.  Decrease the risk of developing gestational diabetes mellitus (GDM).  Improve blood sugar (glucose) control for women who have GDM.  Decrease the risk of developing preeclampsia. This is a serious condition that causes high blood pressure along with other symptoms, such as swelling and headaches.  Decrease the risk of cesarean delivery.  Speed up the recovery after giving birth.  How often should I exercise? Unless your health care provider gives you different instructions, you should try to exercise on most days or all days of the week. In general, try to exercise with moderate intensity for about 150 minutes per week. This can be spread out across several days, such as exercising for 30 minutes per day on 5 days of each week. You can tell that you are exercising at a moderate intensity if you have a higher heart rate and faster breathing, but you are still able to hold a conversation. What types of moderate-intensity exercise are recommended during pregnancy? There are many types of exercise that are safe for you to do during pregnancy. Unless your health care provider gives you different instructions, do a variety of  exercises that safely increase your heart and breathing (cardiopulmonary) rates and help you to build and maintain muscle strength (strength training). You should always be able to talk in full sentences while exercising during pregnancy. Some examples of exercising that is safe to do during pregnancy include:  Brisk walking or hiking.  Swimming.  Water aerobics.  Riding a stationary bike.  Strength training.  Modified yoga or Pilates. Tell your instructor that you are pregnant. Avoid overstretching and avoid lying on your back for long periods of time.  Running or jogging. Only choose this type of exercise if: ? You ran or jogged regularly before your pregnancy. ? You can run or jog and still talk in complete sentences.  What types of exercise should I not do during pregnancy? Depending on your level of fitness and whether you exercised regularly before your pregnancy, you may be advised to limit vigorous-intensity exercise during your pregnancy. You can tell that you are exercising at a vigorous intensity if you are breathing much harder and faster and cannot hold a conversation while exercising. Some examples of exercising that you should avoid during pregnancy include:  Contact sports.  Activities that place you at risk for falling on or being hit in the belly, such as downhill skiing, water skiing, surfing, rock climbing, cycling, gymnastics, and horseback riding.  Scuba diving.  Sky diving.  Yoga or Pilates in a room that is heated to extreme temperatures ("hot yoga" or "hot Pilates").  Jogging or running, unless you ran or jogged regularly before your pregnancy. While jogging or running, you should always be able to talk in full sentences. Do not run or jog so vigorously   that you are unable to have a conversation.  If you are not used to exercising at elevation (more than 6,000 feet above sea level), do not do so during your pregnancy.  When should I avoid exercising  during pregnancy? Certain medical conditions can make it unsafe to exercise during pregnancy, or they may increase your risk of miscarriage or early labor and birth. Some of these conditions include:  Some types of heart disease.  Some types of lung disease.  Placenta previa. This is when the placenta partially or completely covers the opening of the uterus (cervix).  Frequent bleeding from the vagina during your pregnancy.  Incompetent cervix. This is when your cervix does not remain as tightly closed during pregnancy as it should.  Premature labor.  Ruptured membranes. This is when the protective sac (amniotic sac) opens up and amniotic fluid leaks from your vagina.  Severely low blood count (anemia).  Preeclampsia or pregnancy-caused high blood pressure.  Carrying more than one baby (multiple gestation) and having an additional risk of early labor.  Poorly controlled diabetes.  Being severely underweight or severely overweight.  Intrauterine growth restriction. This is when your baby's growth and development during pregnancy are slower than expected.  Other medical conditions. Ask your health care provider if any apply to you.  What else should I know about exercising during pregnancy? You should take these precautions while exercising during pregnancy:  Avoid overheating. ? Wear loose-fitting, breathable clothes. ? Do not exercise in very high temperatures.  Avoid dehydration. Drink enough water before, during, and after exercise to keep your urine clear or pale yellow.  Avoid overstretching. Because of hormone changes during pregnancy, it is easy to overstretch muscles, tendons, and ligaments during pregnancy.  Start slowly and ask your health care provider to recommend types of exercise that are safe for you, if exercising regularly is new for you.  Pregnancy is not a time for exercising to lose weight. When should I seek medical care? You should stop exercising  and call your health care provider if you have any unusual symptoms, such as:  Mild uterine contractions or abdominal cramping.  Dizziness that does not improve with rest.  When should I seek immediate medical care? You should stop exercising and call your local emergency services (911 in the [place] if you have any unusual symptoms, such as:  Sudden, severe pain in your low back or your belly.  Uterine contractions or abdominal cramping that do not improve with rest.  Chest pain.  Bleeding or fluid leaking from your vagina.  Shortness of breath.  This information is not intended to replace advice given to you by your health care provider. Make sure you discuss any questions you have with your health care provider. Document Released: 11/29/2005 Document Revised: 04/28/2016 Document Reviewed: 02/06/2015 Elsevier Interactive Patient Education  2018 [employer] Eating Plan for Pregnant Women While you are pregnant, your body will require additional nutrition to help support your growing baby. It is recommended that you consume:  150 additional calories each day during your first trimester.  300 additional calories each day during your second trimester.  300 additional calories each day during your third trimester.  Eating a healthy, well-balanced diet is very important for your health and for your baby's health. You also have a higher need for some vitamins and minerals, such as folic acid, calcium, iron, and vitamin D. What do I need to know about eating during pregnancy?  Do not try to lose weight   or go on a diet during pregnancy.  Choose healthy, nutritious foods. Choose  of a sandwich with a glass of milk instead of a candy bar or a high-calorie sugar-sweetened beverage.  Limit your overall intake of foods that have "empty calories." These are foods that have little nutritional value, such as sweets, desserts, candies, sugar-sweetened beverages, and fried foods.  Eat a  variety of foods, especially fruits and vegetables.  Take a prenatal vitamin to help meet the additional needs during pregnancy, specifically for folic acid, iron, calcium, and vitamin D.  Remember to stay active. Ask your health care provider for exercise recommendations that are specific to you.  Practice good food safety and cleanliness, such as washing your hands before you eat and after you prepare raw meat. This helps to prevent foodborne illnesses, such as listeriosis, that can be very dangerous for your baby. Ask your health care provider for more information about listeriosis. What does 150 extra calories look like? Healthy options for an additional 150 calories each day could be any of the following:  Plain low-fat yogurt (6-8 oz) with  cup of berries.  1 apple with 2 teaspoons of peanut butter.  Cut-up vegetables with  cup of hummus.  Low-fat chocolate milk (8 oz or 1 cup).  1 string cheese with 1 medium orange.   of a peanut butter and jelly sandwich on whole-wheat bread (1 tsp of peanut butter).  For 300 calories, you could eat two of those healthy options each day. What is a healthy amount of weight to gain? The recommended amount of weight for you to gain is based on your pre-pregnancy BMI. If your pre-pregnancy BMI was:  Less than 18 (underweight), you should gain 28-40 lb.  18-24.9 (normal), you should gain 25-35 lb.  25-29.9 (overweight), you should gain 15-25 lb.  Greater than 30 (obese), you should gain 11-20 lb.  What if I am having twins or multiples? Generally, pregnant women who will be having twins or multiples may need to increase their daily calories by 300-600 calories each day. The recommended range for total weight gain is 25-54 lb, depending on your pre-pregnancy BMI. Talk with your health care provider for specific guidance about additional nutritional needs, weight gain, and exercise during your pregnancy. What foods can I eat? Grains Any  grains. Try to choose whole grains, such as whole-wheat bread, oatmeal, or brown rice. Vegetables Any vegetables. Try to eat a variety of colors and types of vegetables to get a full range of vitamins and minerals. Remember to wash your vegetables well before eating. Fruits Any fruits. Try to eat a variety of colors and types of fruit to get a full range of vitamins and minerals. Remember to wash your fruits well before eating. Meats and Other Protein Sources Lean meats, including chicken, [place], fish, and lean cuts of beef, veal, or pork. Make sure that all meats are cooked to "well done." Tofu. Tempeh. Beans. Eggs. Peanut butter and other nut butters. Seafood, such as shrimp, crab, and lobster. If you choose fish, select types that are higher in omega-3 fatty acids, including salmon, herring, mussels, trout, sardines, and pollock. Make sure that all meats are cooked to food-safe temperatures. Dairy Pasteurized milk and milk alternatives. Pasteurized yogurt and pasteurized cheese. Cottage cheese. Sour cream. Beverages Water. Juices that contain 100% fruit juice or vegetable juice. Caffeine-free teas and decaffeinated coffee. Drinks that contain caffeine are okay to drink, but it is better to avoid caffeine. Keep your total caffeine  intake to less than 200 mg each day (12 oz of coffee, tea, or soda) or as directed by your health care provider. Condiments Any pasteurized condiments. Sweets and Desserts Any sweets and desserts. Fats and Oils Any fats and oils. The items listed above may not be a complete list of recommended foods or beverages. Contact your dietitian for more options. What foods are not recommended? Vegetables Unpasteurized (raw) vegetable juices. Fruits Unpasteurized (raw) fruit juices. Meats and Other Protein Sources Cured meats that have nitrates, such as bacon, salami, and hotdogs. Luncheon meats, bologna, or other deli meats (unless they are reheated until they are  steaming hot). Refrigerated pate, meat spreads from a meat counter, smoked seafood that is found in the refrigerated section of a store. Raw fish, such as sushi or sashimi. High mercury content fish, such as tilefish, shark, swordfish, and king mackerel. Raw meats, such as tuna or beef tartare. Undercooked meats and poultry. Make sure that all meats are cooked to food-safe temperatures. Dairy Unpasteurized (raw) milk and any foods that have raw milk in them. Soft cheeses, such as feta, queso blanco, queso fresco, Brie, Camembert cheeses, blue-veined cheeses, and Panela cheese (unless it is made with pasteurized milk, which must be stated on the label). Beverages Alcohol. Sugar-sweetened beverages, such as sodas, teas, or energy drinks. Condiments Homemade fermented foods and drinks, such as pickles, sauerkraut, or kombucha drinks. (Store-bought pasteurized versions of these are okay.) Other Salads that are made in the store, such as ham salad, chicken salad, egg salad, tuna salad, and seafood salad. The items listed above may not be a complete list of foods and beverages to avoid. Contact your dietitian for more information. This information is not intended to replace advice given to you by your health care provider. Make sure you discuss any questions you have with your health care provider. Document Released: 09/13/2014 Document Revised: 05/06/2016 Document Reviewed: 05/14/2014 Elsevier Interactive Patient Education  2018 [employer]. [facility] 2018 Prenatal Education Class Schedule Register at [email or web address] in the Classes & Resources Link or call Mardi Mainland at [phone] 9:00a-5:00p M-F  Childbirth Preparation Certified Childbirth Educators teach this 5 week course.  Expectant parents are encouraged to take this class in their 3rd trimester, completing it by their 35-36th week. Meets in [facility], Lower Level.  Mondays Thursdays  7:00-9:00 p 7:00-9:00  p  July 23 - August 20 July 19 - August 16  September 17 - October 15 September 6 -October 4  November 5 - December 3 October 25 - November 29   No Class on Thanksgiving Day -November 22  Childbirth Preparation Refresher Course For those who have previously attended Prepared Childbirth Preparation classes, this class in incorporated into the 3rd and 4th classes in the Monday night childbirth series.  Course meets in the [facility]. Lower Level from 7:00p - 9:00p  August 6 & 13  October 1 & 8  November 19 & 26   Weekend Childbirth Aundria Mems Classes are held Saturday & Sunday, 1:00 5:00p Course meets in [facility] [place] Level  August 4 & 5  November 3 & 4    The [address] Free tours are held on the third Sunday of each month at 3 pm.  The tour meets in the third floor waiting area and will take approximately 30 minutes.  Tours are also included in Childbirth class series as well as Brother/Sister class.  An online virtual tour can be seen at [email or web address]  Breastfeeding & Infant Nutrition The course incorporates returning to work or school.  Breast milk collection and storage with basic breastfeeding and infant nutrition. This two-class course is held the 2nd and 3rd Tuesday of each month from 7:00 -9:00 pm.  Course meets in the [employer] [facility] Lower level  June 12 & 19 July-No Class  August 14 & 21 September 11 &18  September 11 & 18 October 9 &16  November 13 & 20 December 11 & 18   [facility] [facility] welcomes any mother for a social outing with other Moms to share experiences and challenges in an informal setting.  Meets the 1st Thursday and 3rd Thursday 11:30a-1:00 pm of each month in [facility] 3rd floor classroom.  No registration required.  Newborn Essentials This course covers bathing, diapering, swaddling and more with practice on lifelike dolls.  Participants will also learn safety tips and infant CPR (Not for  certification).  It is held the 1st Wednesday of each month from 7:00p-9:00p in the [facility] Center, Lower level.  June 6 July- No Class  August 1 September 5  October 3 November 7  December 7    Preparing Big Brother & Sister This one session course prepares children and their parents for the arrival of a new baby.  It is held on the 1st Tuesday of each month from 6:30p - 8:00p. Course meets in the [facility] Center, Lower level.  July-No Class August 7  September 4 October 2  November 6 December 4   [employer] This nationally acclaimed class helps expecting and new dads with the basic skills and confidence to bond with their infants, support their mates, and provide a safe and healthy home environment for their new family. Classes are held the 2nd Saturday of every month from 9:00a - 12:00 noon.  Course meets in the [facility] Center Lower level.  June 9 August 11  October 13 No Class in December

## 2017-06-14 ENCOUNTER — Ambulatory Visit (INDEPENDENT_AMBULATORY_CARE_PROVIDER_SITE_OTHER): Payer: Medicaid Other | Admitting: Obstetrics and Gynecology

## 2017-06-14 VITALS — BP 124/72 | HR 82 | Wt 193.7 lb

## 2017-06-14 DIAGNOSIS — Z3493 Encounter for supervision of normal pregnancy, unspecified, third trimester: Secondary | ICD-10-CM

## 2017-06-14 LAB — POCT URINALYSIS DIPSTICK
Bilirubin, UA: NEGATIVE
Glucose, UA: NEGATIVE
Ketones, UA: NEGATIVE
LEUKOCYTES UA: NEGATIVE
NITRITE UA: NEGATIVE
PH UA: 7 (ref 5.0–8.0)
PROTEIN UA: NEGATIVE
RBC UA: NEGATIVE
Spec Grav, UA: 1.01 (ref 1.010–1.025)
Urobilinogen, UA: 0.2 E.U./dL

## 2017-06-14 NOTE — Progress Notes (Signed)
ROB-doing well; having BHC;  Cultures next visit.

## 2017-06-14 NOTE — Progress Notes (Signed)
ROB- pt is doing well 

## 2017-06-28 ENCOUNTER — Ambulatory Visit (INDEPENDENT_AMBULATORY_CARE_PROVIDER_SITE_OTHER): Payer: Medicaid Other | Admitting: Obstetrics and Gynecology

## 2017-06-28 VITALS — BP 104/46 | HR 87 | Wt 196.0 lb

## 2017-06-28 DIAGNOSIS — Z3685 Encounter for antenatal screening for Streptococcus B: Secondary | ICD-10-CM

## 2017-06-28 DIAGNOSIS — Z3493 Encounter for supervision of normal pregnancy, unspecified, third trimester: Secondary | ICD-10-CM

## 2017-06-28 DIAGNOSIS — Z113 Encounter for screening for infections with a predominantly sexual mode of transmission: Secondary | ICD-10-CM

## 2017-06-28 LAB — POCT URINALYSIS DIPSTICK
BILIRUBIN UA: NEGATIVE
Glucose, UA: NEGATIVE
KETONES UA: NEGATIVE
Leukocytes, UA: NEGATIVE
Nitrite, UA: NEGATIVE
PH UA: 6 (ref 5.0–8.0)
PROTEIN UA: NEGATIVE
RBC UA: NEGATIVE
SPEC GRAV UA: 1.01 (ref 1.010–1.025)
Urobilinogen, UA: 0.2 E.U./dL

## 2017-06-28 NOTE — Progress Notes (Signed)
ROB- cultures obtained today, pt is having a lot of pelvic pressure 

## 2017-06-28 NOTE — Progress Notes (Signed)
ROB-doing well, cultures obtained.labor precautions discussed.

## 2017-06-30 LAB — STREP GP B NAA: Strep Gp B NAA: NEGATIVE

## 2017-07-01 LAB — GC/CHLAMYDIA PROBE AMP
Chlamydia trachomatis, NAA: NEGATIVE
NEISSERIA GONORRHOEAE BY PCR: NEGATIVE

## 2017-07-05 ENCOUNTER — Encounter: Payer: Self-pay | Admitting: Certified Nurse Midwife

## 2017-07-05 ENCOUNTER — Ambulatory Visit (INDEPENDENT_AMBULATORY_CARE_PROVIDER_SITE_OTHER): Payer: Medicaid Other | Admitting: Certified Nurse Midwife

## 2017-07-05 VITALS — BP 113/66 | HR 82 | Wt 196.2 lb

## 2017-07-05 DIAGNOSIS — Z3493 Encounter for supervision of normal pregnancy, unspecified, third trimester: Secondary | ICD-10-CM

## 2017-07-05 LAB — POCT URINALYSIS DIPSTICK
Bilirubin, UA: NEGATIVE
Blood, UA: NEGATIVE
Glucose, UA: NEGATIVE
KETONES UA: NEGATIVE
LEUKOCYTES UA: NEGATIVE
NITRITE UA: NEGATIVE
PH UA: 6 (ref 5.0–8.0)
PROTEIN UA: NEGATIVE
Spec Grav, UA: 1.01 (ref 1.010–1.025)
Urobilinogen, UA: 0.2 E.U./dL

## 2017-07-05 NOTE — Progress Notes (Signed)
ROB, doing well. No complaints. Labor precautions reviewed. Follow up 1 wk.   Doreene BurkeAnnie Moo Gravley, CNM

## 2017-07-05 NOTE — Patient Instructions (Signed)

## 2017-07-12 ENCOUNTER — Ambulatory Visit (INDEPENDENT_AMBULATORY_CARE_PROVIDER_SITE_OTHER): Payer: Medicaid Other | Admitting: Certified Nurse Midwife

## 2017-07-12 VITALS — BP 115/68 | HR 83 | Wt 198.0 lb

## 2017-07-12 DIAGNOSIS — Z3493 Encounter for supervision of normal pregnancy, unspecified, third trimester: Secondary | ICD-10-CM

## 2017-07-12 LAB — POCT URINALYSIS DIPSTICK
Bilirubin, UA: NEGATIVE
Glucose, UA: NEGATIVE
KETONES UA: NEGATIVE
Nitrite, UA: NEGATIVE
PH UA: 6 (ref 5.0–8.0)
PROTEIN UA: NEGATIVE
RBC UA: NEGATIVE
SPEC GRAV UA: 1.01 (ref 1.010–1.025)
Urobilinogen, UA: 0.2 E.U./dL

## 2017-07-12 NOTE — Patient Instructions (Signed)
Vaginal Delivery Vaginal delivery means that you will give birth by pushing your baby out of your birth canal (vagina). A team of health care providers will help you before, during, and after vaginal delivery. Birth experiences are unique for every woman and every pregnancy, and birth experiences vary depending on where you choose to give birth. What should I do to prepare for my baby's birth? Before your baby is born, it is important to talk with your health care provider about:  Your labor and delivery preferences. These may include: ? Medicines that you may be given. ? How you will manage your pain. This might include non-medical pain relief techniques or injectable pain relief such as epidural analgesia. ? How you and your baby will be monitored during labor and delivery. ? Who may be in the labor and delivery room with you. ? Your feelings about surgical delivery of your baby (cesarean delivery, or C-section) if this becomes necessary. ? Your feelings about receiving donated blood through an IV tube (blood transfusion) if this becomes necessary.  Whether you are able: ? To take pictures or videos of the birth. ? To eat during labor and delivery. ? To move around, walk, or change positions during labor and delivery.  What to expect after your baby is born, such as: ? Whether delayed umbilical cord clamping and cutting is offered. ? Who will care for your baby right after birth. ? Medicines or tests that may be recommended for your baby. ? Whether breastfeeding is supported in your hospital or birth center. ? How long you will be in the hospital or birth center.  How any medical conditions you have may affect your baby or your labor and delivery experience.  To prepare for your baby's birth, you should also:  Attend all of your health care visits before delivery (prenatal visits) as recommended by your health care provider. This is important.  Prepare your home for your baby's  arrival. Make sure that you have: ? Diapers. ? Baby clothing. ? Feeding equipment. ? Safe sleeping arrangements for you and your baby.  Install a car seat in your vehicle. Have your car seat checked by a certified car seat installer to make sure that it is installed safely.  Think about who will help you with your new baby at home for at least the first several weeks after delivery.  What can I expect when I arrive at the birth center or hospital? Once you are in labor and have been admitted into the hospital or birth center, your health care provider may:  Review your pregnancy history and any concerns you have.  Insert an IV tube into one of your veins. This is used to give you fluids and medicines.  Check your blood pressure, pulse, temperature, and heart rate (vital signs).  Check whether your bag of water (amniotic sac) has broken (ruptured).  Talk with you about your birth plan and discuss pain control options.  Monitoring Your health care provider may monitor your contractions (uterine monitoring) and your baby's heart rate (fetal monitoring). You may need to be monitored:  Often, but not continuously (intermittently).  All the time or for long periods at a time (continuously). Continuous monitoring may be needed if: ? You are taking certain medicines, such as medicine to relieve pain or make your contractions stronger. ? You have pregnancy or labor complications.  Monitoring may be done by:  Placing a special stethoscope or a handheld monitoring device on your abdomen to   check your baby's heartbeat, and feeling your abdomen for contractions. This method of monitoring does not continuously record your baby's heartbeat or your contractions.  Placing monitors on your abdomen (external monitors) to record your baby's heartbeat and the frequency and length of contractions. You may not have to wear external monitors all the time.  Placing monitors inside of your uterus  (internal monitors) to record your baby's heartbeat and the frequency, length, and strength of your contractions. ? Your health care provider may use internal monitors if he or she needs more information about the strength of your contractions or your baby's heart rate. ? Internal monitors are put in place by passing a thin, flexible wire through your vagina and into your uterus. Depending on the type of monitor, it may remain in your uterus or on your baby's head until birth. ? Your health care provider will discuss the benefits and risks of internal monitoring with you and will ask for your permission before inserting the monitors.  Telemetry. This is a type of continuous monitoring that can be done with external or internal monitors. Instead of having to stay in bed, you are able to move around during telemetry. Ask your health care provider if telemetry is an option for you.  Physical exam Your health care provider may perform a physical exam. This may include:  Checking whether your baby is positioned: ? With the head toward your vagina (head-down). This is most common. ? With the head toward the top of your uterus (head-up or breech). If your baby is in a breech position, your health care provider may try to turn your baby to a head-down position so you can deliver vaginally. If it does not seem that your baby can be born vaginally, your provider may recommend surgery to deliver your baby. In rare cases, you may be able to deliver vaginally if your baby is head-up (breech delivery). ? Lying sideways (transverse). Babies that are lying sideways cannot be delivered vaginally.  Checking your cervix to determine: ? Whether it is thinning out (effacing). ? Whether it is opening up (dilating). ? How low your baby has moved into your birth canal.  What are the three stages of labor and delivery?  Normal labor and delivery is divided into the following three stages: Stage 1  Stage 1 is the  longest stage of labor, and it can last for hours or days. Stage 1 includes: ? Early labor. This is when contractions may be irregular, or regular and mild. Generally, early labor contractions are more than 10 minutes apart. ? Active labor. This is when contractions get longer, more regular, more frequent, and more intense. ? The transition phase. This is when contractions happen very close together, are very intense, and may last longer than during any other part of labor.  Contractions generally feel mild, infrequent, and irregular at first. They get stronger, more frequent (about every 2-3 minutes), and more regular as you progress from early labor through active labor and transition.  Many women progress through stage 1 naturally, but you may need help to continue making progress. If this happens, your health care provider may talk with you about: ? Rupturing your amniotic sac if it has not ruptured yet. ? Giving you medicine to help make your contractions stronger and more frequent.  Stage 1 ends when your cervix is completely dilated to 4 inches (10 cm) and completely effaced. This happens at the end of the transition phase. Stage 2  Once   your cervix is completely effaced and dilated to 4 inches (10 cm), you may start to feel an urge to push. It is common for the body to naturally take a rest before feeling the urge to push, especially if you received an epidural or certain other pain medicines. This rest period may last for up to 1-2 hours, depending on your unique labor experience.  During stage 2, contractions are generally less painful, because pushing helps relieve contraction pain. Instead of contraction pain, you may feel stretching and burning pain, especially when the widest part of your baby's head passes through the vaginal opening (crowning).  Your health care provider will closely monitor your pushing progress and your baby's progress through the vagina during stage 2.  Your  health care provider may massage the area of skin between your vaginal opening and anus (perineum) or apply warm compresses to your perineum. This helps it stretch as the baby's head starts to crown, which can help prevent perineal tearing. ? In some cases, an incision may be made in your perineum (episiotomy) to allow the baby to pass through the vaginal opening. An episiotomy helps to make the opening of the vagina larger to allow more room for the baby to fit through.  It is very important to breathe and focus so your health care provider can control the delivery of your baby's head. Your health care provider may have you decrease the intensity of your pushing, to help prevent perineal tearing.  After delivery of your baby's head, the shoulders and the rest of the body generally deliver very quickly and without difficulty.  Once your baby is delivered, the umbilical cord may be cut right away, or this may be delayed for 1-2 minutes, depending on your baby's health. This may vary among health care providers, hospitals, and birth centers.  If you and your baby are healthy enough, your baby may be placed on your chest or abdomen to help maintain the baby's temperature and to help you bond with each other. Some mothers and babies start breastfeeding at this time. Your health care team will dry your baby and help keep your baby warm during this time.  Your baby may need immediate care if he or she: ? Showed signs of distress during labor. ? Has a medical condition. ? Was born too early (prematurely). ? Had a bowel movement before birth (meconium). ? Shows signs of difficulty transitioning from being inside the uterus to being outside of the uterus. If you are planning to breastfeed, your health care team will help you begin a feeding. Stage 3  The third stage of labor starts immediately after the birth of your baby and ends after you deliver the placenta. The placenta is an organ that develops  during pregnancy to provide oxygen and nutrients to your baby in the womb.  Delivering the placenta may require some pushing, and you may have mild contractions. Breastfeeding can stimulate contractions to help you deliver the placenta.  After the placenta is delivered, your uterus should tighten (contract) and become firm. This helps to stop bleeding in your uterus. To help your uterus contract and to control bleeding, your health care provider may: ? Give you medicine by injection, through an IV tube, by mouth, or through your rectum (rectally). ? Massage your abdomen or perform a vaginal exam to remove any blood clots that are left in your uterus. ? Empty your bladder by placing a thin, flexible tube (catheter) into your bladder. ? Encourage   you to breastfeed your baby. After labor is over, you and your baby will be monitored closely to ensure that you are both healthy until you are ready to go home. Your health care team will teach you how to care for yourself and your baby. This information is not intended to replace advice given to you by your health care provider. Make sure you discuss any questions you have with your health care provider. Document Released: 09/07/2008 Document Revised: 06/18/2016 Document Reviewed: 12/14/2015 Elsevier Interactive Patient Education  2018 [employer]  

## 2017-07-12 NOTE — Progress Notes (Signed)
ROB-Pt doing well, reports irregular contractions and increased pelvic pressures. Discussed home labor preparation methods including use of birthing ball. Reviewed red flag symptoms and when to call. RTC x 1 week for ROB or sooner if needed.

## 2017-07-19 ENCOUNTER — Ambulatory Visit (INDEPENDENT_AMBULATORY_CARE_PROVIDER_SITE_OTHER): Payer: Medicaid Other | Admitting: Certified Nurse Midwife

## 2017-07-19 ENCOUNTER — Encounter: Payer: Self-pay | Admitting: Certified Nurse Midwife

## 2017-07-19 VITALS — BP 113/67 | HR 81 | Wt 197.4 lb

## 2017-07-19 DIAGNOSIS — Z3493 Encounter for supervision of normal pregnancy, unspecified, third trimester: Secondary | ICD-10-CM

## 2017-07-19 LAB — POCT URINALYSIS DIPSTICK
BILIRUBIN UA: NEGATIVE
Glucose, UA: NEGATIVE
Ketones, UA: NEGATIVE
NITRITE UA: NEGATIVE
PH UA: 6 (ref 5.0–8.0)
PROTEIN UA: NEGATIVE
RBC UA: NEGATIVE
Spec Grav, UA: >=1.03 — AB (ref 1.010–1.025)
UROBILINOGEN UA: 0.2 U/dL

## 2017-07-19 MED ORDER — CITRANATAL HARMONY 27-1-260 MG PO CAPS: 1.0000 | ORAL_CAPSULE | Freq: Every day | ORAL | 6 refills | Status: DC

## 2017-07-19 NOTE — Patient Instructions (Signed)
Augmentation of Labor Augmentation of labor is when steps are taken to stimulate and strengthen uterine contractions during labor. This may be done when the contractions have slowed down or stopped, delaying progress of labor and delivery of the baby. Before beginning augmentation of labor, the health care provider will evaluate the condition of the mother and baby, the size and position of the baby, and the size of the birth canal. What are the reasons for labor augmentation? Reasons for augmentation of labor include:  Slow labor (prolonged first and second stage of labor) that has been associated with increased maternal risks, such as chorioamnionitis, postpartum hemorrhage, operative vaginal delivery, or third-degree or fourth-degree perineal lacerations.  Decreased average length of labor.  What methods are used for labor augmentation? Various methods may be used for augmentation of labor, including:  Oxytocin medicine. This medicine stimulates contractions. It is given through an IV access tube inserted into a vein.  Breaking the fluid-filled sac that surrounds the fetus (amniotic sac).  Stripping the membranes. The health care provider separates amniotic sac tissue from the cervix, causing the release of a hormone called progesterone that can stimulate uterine contractions.  Nipple stimulation.  Stimulation of certain pressure points on the ankles.  Manual or mechanical dilation of the cervix.  What are the risks associated with labor augmentation?  Overstimulation of the uterine contractions (continuous, prolonged, very strong contractions), causing fetal distress.  Increased chance of infection for the mother and baby.  Uterine tearing (rupture).  Breaking off (abruption) of the placenta.  Increased chance of cesarean, forceps, or vacuum delivery. What are some reasons for not doing labor augmentation? Augmentation of labor should not be done if:  The baby is too big for  the birth canal. This can be confirmed by ultrasonography.  The umbilical cord drops in front of the baby's head or breech part (prolapsed cord).  The mother had a previous cesarean delivery with a vertical incision in the uterus (or the kind of incision used is not known). High dose oxytocin should not be used if the mother had a previous cesarean delivery of any kind.  The mother had previous surgery on or into the uterus.  The mother has herpes.  The mother has cervical cancer.  The baby is lying sideways.  The mother's pelvis is deformed.  The mother is pregnant with more than two babies.  This information is not intended to replace advice given to you by your health care provider. Make sure you discuss any questions you have with your health care provider. Document Released: 05/24/2007 Document Revised: 05/12/2016 Document Reviewed: 06/28/2013 Elsevier Interactive Patient Education  2017 Elsevier Inc.  

## 2017-07-19 NOTE — Progress Notes (Signed)
ROB- no complaints.  

## 2017-07-20 ENCOUNTER — Other Ambulatory Visit: Payer: Self-pay | Admitting: Certified Nurse Midwife

## 2017-07-20 DIAGNOSIS — O480 Post-term pregnancy: Secondary | ICD-10-CM

## 2017-07-21 NOTE — Progress Notes (Signed)
ROB-Pt doing well, no questions or concerns. Discussed practice policies and postdate induction of labor. Education regarding home labor preparation methods. Reviewed red flag symptoms and when to call. RTC x 1 week for BPP and ROB.

## 2017-07-25 ENCOUNTER — Observation Stay
Admission: EM | Admit: 2017-07-25 | Discharge: 2017-07-25 | Disposition: A | Discharge status: Home / Self Care | Payer: Medicaid Other | Source: Home / Self Care | Admitting: Obstetrics and Gynecology

## 2017-07-25 ENCOUNTER — Encounter: Payer: Self-pay | Admitting: *Deleted

## 2017-07-25 DIAGNOSIS — O24420 Gestational diabetes mellitus in childbirth, diet controlled: Secondary | ICD-10-CM | POA: Diagnosis not present

## 2017-07-25 DIAGNOSIS — Z3A40 40 weeks gestation of pregnancy: Secondary | ICD-10-CM

## 2017-07-25 DIAGNOSIS — O471 False labor at or after 37 completed weeks of gestation: Secondary | ICD-10-CM | POA: Diagnosis not present

## 2017-07-25 LAB — ROM PLUS (ARMC ONLY): ROM PLUS: NEGATIVE

## 2017-07-25 NOTE — Progress Notes (Signed)
Galen ManilaM Shambley CNM notified of neg ROM + result. Discharge order given

## 2017-07-25 NOTE — OB Triage Note (Signed)
Presents with complaint of her panties being wet last night and losing her mucous plug. Denies any continuous leaking.   Called OB office and they instructed her to come here

## 2017-07-26 ENCOUNTER — Ambulatory Visit (INDEPENDENT_AMBULATORY_CARE_PROVIDER_SITE_OTHER): Payer: Medicaid Other | Admitting: Certified Nurse Midwife

## 2017-07-26 ENCOUNTER — Inpatient Hospital Stay
Admission: EM | Admit: 2017-07-26 | Discharge: 2017-07-29 | DRG: 775 | Disposition: A | Discharge status: Home / Self Care | Payer: Medicaid Other | Attending: Obstetrics and Gynecology | Admitting: Obstetrics and Gynecology

## 2017-07-26 ENCOUNTER — Ambulatory Visit (INDEPENDENT_AMBULATORY_CARE_PROVIDER_SITE_OTHER): Payer: Medicaid Other

## 2017-07-26 ENCOUNTER — Encounter: Payer: Self-pay | Admitting: Certified Nurse Midwife

## 2017-07-26 VITALS — BP 125/81 | HR 89 | Wt 200.9 lb

## 2017-07-26 DIAGNOSIS — D649 Anemia, unspecified: Secondary | ICD-10-CM | POA: Diagnosis present

## 2017-07-26 DIAGNOSIS — Z3490 Encounter for supervision of normal pregnancy, unspecified, unspecified trimester: Secondary | ICD-10-CM | POA: Diagnosis present

## 2017-07-26 DIAGNOSIS — O4103X0 Oligohydramnios, third trimester, not applicable or unspecified: Secondary | ICD-10-CM | POA: Diagnosis present

## 2017-07-26 DIAGNOSIS — Z3A40 40 weeks gestation of pregnancy: Secondary | ICD-10-CM | POA: Diagnosis not present

## 2017-07-26 DIAGNOSIS — J45909 Unspecified asthma, uncomplicated: Secondary | ICD-10-CM | POA: Diagnosis present

## 2017-07-26 DIAGNOSIS — O9902 Anemia complicating childbirth: Secondary | ICD-10-CM | POA: Diagnosis present

## 2017-07-26 DIAGNOSIS — Z3493 Encounter for supervision of normal pregnancy, unspecified, third trimester: Secondary | ICD-10-CM

## 2017-07-26 DIAGNOSIS — O9952 Diseases of the respiratory system complicating childbirth: Secondary | ICD-10-CM | POA: Diagnosis present

## 2017-07-26 DIAGNOSIS — O480 Post-term pregnancy: Secondary | ICD-10-CM | POA: Diagnosis not present

## 2017-07-26 HISTORY — DX: Other specified health status: Z78.9

## 2017-07-26 LAB — CBC
HEMATOCRIT: 37.4 % (ref 35.0–47.0)
Hemoglobin: 12.7 g/dL (ref 12.0–16.0)
MCH: 30.7 pg (ref 26.0–34.0)
MCHC: 33.9 g/dL (ref 32.0–36.0)
MCV: 90.5 fL (ref 80.0–100.0)
PLATELETS: 208 10*3/uL (ref 150–440)
RBC: 4.13 MIL/uL (ref 3.80–5.20)
RDW: 13.2 % (ref 11.5–14.5)
WBC: 11.5 10*3/uL — ABNORMAL HIGH (ref 3.6–11.0)

## 2017-07-26 LAB — POCT URINALYSIS DIPSTICK
Bilirubin, UA: NEGATIVE
Glucose, UA: NEGATIVE
Ketones, UA: NEGATIVE
NITRITE UA: NEGATIVE
PH UA: 6.5 (ref 5.0–8.0)
PROTEIN UA: NEGATIVE
RBC UA: NEGATIVE
SPEC GRAV UA: 1.025 (ref 1.010–1.025)
Urobilinogen, UA: NEGATIVE E.U./dL — AB

## 2017-07-26 LAB — TYPE AND SCREEN
ABO/RH(D): A POS
Antibody Screen: NEGATIVE

## 2017-07-26 MED ORDER — BUTORPHANOL TARTRATE 2 MG/ML IJ SOLN
1.0000 mg | INTRAMUSCULAR | Status: DC | PRN
  Administered 2017-07-27 (×2): 1 mg via INTRAVENOUS
  Filled 2017-07-26 (×2): qty 1

## 2017-07-26 MED ORDER — OXYTOCIN 40 UNITS IN LACTATED RINGERS INFUSION - SIMPLE MED: 1.0000 m[IU]/min | INTRAVENOUS | Status: DC

## 2017-07-26 MED ORDER — ZOLPIDEM TARTRATE 5 MG PO TABS: 5.0000 mg | ORAL_TABLET | Freq: Every evening | ORAL | Status: DC | PRN

## 2017-07-26 MED ORDER — LIDOCAINE HCL (PF) 1 % IJ SOLN: 30.0000 mL | INTRAMUSCULAR | Status: DC | PRN

## 2017-07-26 MED ORDER — OXYCODONE-ACETAMINOPHEN 5-325 MG PO TABS: 2.0000 | ORAL_TABLET | ORAL | Status: DC | PRN

## 2017-07-26 MED ORDER — OXYTOCIN BOLUS FROM INFUSION: 500.0000 mL | Freq: Once | INTRAVENOUS | Status: DC

## 2017-07-26 MED ORDER — OXYCODONE-ACETAMINOPHEN 5-325 MG PO TABS: 1.0000 | ORAL_TABLET | ORAL | Status: DC | PRN

## 2017-07-26 MED ORDER — TERBUTALINE SULFATE 1 MG/ML IJ SOLN
0.2500 mg | Freq: Once | INTRAMUSCULAR | Status: AC | PRN
  Administered 2017-07-27: 0.25 mg via SUBCUTANEOUS

## 2017-07-26 MED ORDER — LACTATED RINGERS IV SOLN
500.0000 mL | INTRAVENOUS | Status: DC | PRN
  Administered 2017-07-27: 500 mL via INTRAVENOUS
  Administered 2017-07-27: 250 mL via INTRAVENOUS

## 2017-07-26 MED ORDER — MISOPROSTOL 25 MCG QUARTER TABLET
50.0000 ug | ORAL_TABLET | ORAL | Status: DC | PRN
  Administered 2017-07-26 – 2017-07-27 (×3): 50 ug via VAGINAL
  Filled 2017-07-26 (×4): qty 2

## 2017-07-26 MED ORDER — ONDANSETRON HCL 4 MG/2ML IJ SOLN: 4.0000 mg | Freq: Four times a day (QID) | INTRAMUSCULAR | Status: DC | PRN

## 2017-07-26 MED ORDER — FLEET ENEMA 7-19 GM/118ML RE ENEM: 1.0000 | ENEMA | Freq: Every day | RECTAL | Status: DC | PRN

## 2017-07-26 MED ORDER — OXYTOCIN 40 UNITS IN LACTATED RINGERS INFUSION - SIMPLE MED
2.5000 [IU]/h | INTRAVENOUS | Status: DC
  Filled 2017-07-26: qty 1000

## 2017-07-26 MED ORDER — LACTATED RINGERS IV SOLN
INTRAVENOUS | Status: DC
  Administered 2017-07-26: 17:00:00 via INTRAVENOUS

## 2017-07-26 MED ORDER — ACETAMINOPHEN 325 MG PO TABS: 650.0000 mg | ORAL_TABLET | ORAL | Status: DC | PRN

## 2017-07-26 MED ORDER — SOD CITRATE-CITRIC ACID 500-334 MG/5ML PO SOLN: 30.0000 mL | ORAL | Status: DC | PRN

## 2017-07-26 NOTE — Patient Instructions (Signed)
Augmentation of Labor Augmentation of labor is when steps are taken to stimulate and strengthen uterine contractions during labor. This may be done when the contractions have slowed down or stopped, delaying progress of labor and delivery of the baby. Before beginning augmentation of labor, the health care provider will evaluate the condition of the mother and baby, the size and position of the baby, and the size of the birth canal. What are the reasons for labor augmentation? Reasons for augmentation of labor include:  Slow labor (prolonged first and second stage of labor) that has been associated with increased maternal risks, such as chorioamnionitis, postpartum hemorrhage, operative vaginal delivery, or third-degree or fourth-degree perineal lacerations.  Decreased average length of labor.  What methods are used for labor augmentation? Various methods may be used for augmentation of labor, including:  Oxytocin medicine. This medicine stimulates contractions. It is given through an IV access tube inserted into a vein.  Breaking the fluid-filled sac that surrounds the fetus (amniotic sac).  Stripping the membranes. The health care provider separates amniotic sac tissue from the cervix, causing the release of a hormone called progesterone that can stimulate uterine contractions.  Nipple stimulation.  Stimulation of certain pressure points on the ankles.  Manual or mechanical dilation of the cervix.  What are the risks associated with labor augmentation?  Overstimulation of the uterine contractions (continuous, prolonged, very strong contractions), causing fetal distress.  Increased chance of infection for the mother and baby.  Uterine tearing (rupture).  Breaking off (abruption) of the placenta.  Increased chance of cesarean, forceps, or vacuum delivery. What are some reasons for not doing labor augmentation? Augmentation of labor should not be done if:  The baby is too big for  the birth canal. This can be confirmed by ultrasonography.  The umbilical cord drops in front of the baby's head or breech part (prolapsed cord).  The mother had a previous cesarean delivery with a vertical incision in the uterus (or the kind of incision used is not known). High dose oxytocin should not be used if the mother had a previous cesarean delivery of any kind.  The mother had previous surgery on or into the uterus.  The mother has herpes.  The mother has cervical cancer.  The baby is lying sideways.  The mother's pelvis is deformed.  The mother is pregnant with more than two babies.  This information is not intended to replace advice given to you by your health care provider. Make sure you discuss any questions you have with your health care provider. Document Released: 05/24/2007 Document Revised: 05/12/2016 Document Reviewed: 06/28/2013 Elsevier Interactive Patient Education  2017 Elsevier Inc.  

## 2017-07-26 NOTE — Progress Notes (Signed)
ROB-Pt here today for ROB and BPP: 6/8, oligohydramnios at 2.5 cm. Advised induction of labor at this time, patient and family agree to plan of care. Patient will go home to pick up belongings then [facility], Danaammy. Dr. Greggory KeeneFrancesco aware of patient admission and plan of care. RTC x 6 weeks for postpartum visit.  ULTRASOUND REPORT  Location: [facility] Date of Service: 07/26/17  Indications: BPP Findings:  Mason JimSingleton intrauterine pregnancy is visualized with FHR at 133 BPM.  Fetal presentation is vertex, spine left lateral.  Placenta: Anterior, grade 3 with calcifications seen. AFI: SEVERE OLIGOHYDRAMNIOS AT 2.5 CM.  Anatomic survey of the fetal stomach, bladder and kidneys appears WNL. Gender - Female.   BPP: 6/8, for Oligo.  Impression: 1. BPP: 6/8, good fetal breathing movements, tone and gross body movements. Oligohydramnios.  Recommendations: 1.Clinical correlation with the patient's History and Physical Exam.

## 2017-07-26 NOTE — Discharge Summary (Signed)
L&D OB Triage Note  Lauren Mercer is a 26 y.o. G1P0 female at 2430w4d, EDD Estimated Date of Delivery: 07/22/17 who presented to triage for complaints of small gush of fluid with mucus plug last night, none since.  She was evaluated by the nurses with no significant findings/findings significant for ruptured membranes. Vital signs stable. An NST was performed and has been reviewed by me. She was reassured.   NST INTERPRETATION: Indications: rule out uterine contractions  Mode: External Baseline Rate (A): 115 bpm Variability: Moderate Accelerations: 15 x 15 Decelerations: None     Contraction Frequency (min): occasional  Impression: reactive   Plan: NST performed was reviewed and was found to be reactive. She was discharged home with bleeding/labor precautions.  Continue routine prenatal care. Follow up with OB/GYN as previously scheduled.     Melody Suzan NailerN Shambley, CNM

## 2017-07-26 NOTE — H&P (Signed)
Obstetric History and Physical  Lauren Mercer is a 26 y.o. G1P0 with IUP at 3768w4d presenting for induction of labor due to oligohydramnios, AFI 2.5 cm.   Endorses good fetal movement.   Denies difficulty breathing or respiratory distress, chest pain, abdominal pain, vaginal bleeding, leakage of fluid, and leg pain or swelling.    Prenatal Course  [facility], total visits: 14  Pregnancy complications or risks: oligohydramnios, maternal asthma  Prenatal labs and studies:  ABO, Rh: --/--/PENDING (08/14 1628)  Antibody: PENDING (08/14 1628)  Rubella: <0.90 (12/19 1635)  Varicella: >4000 (12/19 1635)  RPR: Non Reactive (12/19 1635)   HBsAg: Negative (12/19 1635)   HIV: Non Reactive (12/19 1635)  NWG:NFAOZHYQGBS:Negative (07/17 1336)  1 hr Glucola: 79 (05/16 1139)  Genetic screening normal (01/26 1553)  Anatomy US normal (04/04 [id number])  Past Medical History:  Diagnosis Date  . Amenorrhea 11/30/2016  . Asthma   . Medical history non-contributory     Past Surgical History:  Procedure Laterality Date  . NO PAST SURGERIES      OB History  Gravida Para Term Preterm AB Living  1            SAB TAB Ectopic Multiple Live Births               # Outcome Date GA Lbr Len/2nd Weight Sex Delivery Anes PTL Lv  1 Current               Social History   Social History  . Marital status: Single    Spouse name: N/A  . Number of children: N/A  . Years of education: N/A   Social History Main Topics  . Smoking status: Never Smoker  . Smokeless tobacco: Never Used  . Alcohol use No  . Drug use: No  . Sexual activity: Yes    Partners: Male    Birth control/ protection: None, Implant   Other Topics Concern  . None   Social History Narrative  . None    Family History  Problem Relation Age of Onset  . Diabetes Maternal Uncle     Prescriptions Prior to Admission  Medication Sig Dispense Refill Last Dose  . albuterol (PROVENTIL HFA;VENTOLIN HFA)  108 (90 Base) MCG/ACT inhaler Inhale 2 puffs into the lungs every 6 (six) hours as needed for wheezing or shortness of breath. 1 Inhaler 2 Past Month at Unknown time  . Prenat-FeFmCb-DSS-FA-DHA w/o A (CITRANATAL HARMONY) 27-1-260 MG CAPS Take 1 capsule by mouth daily. 30 capsule 6 07/25/2017 at Unknown time  . ranitidine (ZANTAC 75) 75 MG tablet Take 1 tablet (75 mg total) by mouth 2 (two) times daily. (Patient not taking: Reported on 07/25/2017) 60 tablet 1 Not Taking at Unknown time    No Known Allergies  Review of Systems: Negative except for what is mentioned in HPI.  Physical Exam:  Temp:  [98.2 F (36.8 C)-98.4 F (36.9 C)] 98.4 F (36.9 C) (08/14 1852) Pulse Rate:  [79-93] 79 (08/14 1852) Resp:  [16] 16 (08/14 1653) BP: (119-135)/(67-82) 119/67 (08/14 1852) Weight:  [200 lb (90.7 kg)-200 lb 14.4 oz (91.1 kg)] 200 lb (90.7 kg) (08/14 1653)  GENERAL: Well-developed, well-nourished female in no acute distress.   LUNGS: Clear to auscultation bilaterally.   HEART: Regular rate and rhythm.  ABDOMEN: Soft, nontender, nondistended, gravid.  EXTREMITIES: Nontender, no edema, 2+ distal pulses.  Cervical Exam: Dilation: Fingertip Effacement (%): 30 Cervical Position: Middle Station: -3 Exam  by:: Maximino Greenland RN  FHT:  Baseline rate 135 bpm   Variability moderate  Accelerations present   Decelerations none  Contractions: Occassional   Pertinent Labs/Studies:   Results for orders placed or performed during the hospital encounter of 07/26/17 (from the past 24 hour(s))  CBC     Status: Abnormal   Collection Time: 07/26/17  4:28 PM  Result Value Ref Range   WBC 11.5 (H) 3.6 - 11.0 K/uL   RBC 4.13 3.80 - 5.20 MIL/uL   Hemoglobin 12.7 12.0 - 16.0 g/dL   HCT 16.1 09.6 - 04.5 %   MCV 90.5 80.0 - 100.0 fL   MCH 30.7 26.0 - 34.0 pg   MCHC 33.9 32.0 - 36.0 g/dL   RDW 40.9 81.1 - 91.4 %   Platelets 208 150 - 440 K/uL  Type and screen [facility]     Status: None  (Preliminary result)   Collection Time: 07/26/17  4:28 PM  Result Value Ref Range   ABO/RH(D) PENDING    Antibody Screen PENDING    Sample Expiration 07/29/2017     Assessment :  Lauren Mercer is a 26 y.o. G1P0 at [redacted]w[redacted]d being admitted for induction of labor due to oligohydramnios, Rubella non-immune  Plan:  Admit to birthing suites, see orders.   Cytotec placed by RN.   Continue orders as written. Reassess as needed.   Dr. Greggory Keen aware of patient admission and plan of care.    Gunnar Bulla, CNM [facility], [facility]

## 2017-07-27 ENCOUNTER — Inpatient Hospital Stay: Payer: Medicaid Other | Admitting: Anesthesiology

## 2017-07-27 DIAGNOSIS — Z3A40 40 weeks gestation of pregnancy: Secondary | ICD-10-CM

## 2017-07-27 DIAGNOSIS — O4103X0 Oligohydramnios, third trimester, not applicable or unspecified: Principal | ICD-10-CM

## 2017-07-27 LAB — RPR: RPR Ser Ql: NONREACTIVE

## 2017-07-27 MED ORDER — SENNOSIDES-DOCUSATE SODIUM 8.6-50 MG PO TABS
2.0000 | ORAL_TABLET | ORAL | Status: DC
  Administered 2017-07-28 – 2017-07-29 (×2): 2 via ORAL
  Filled 2017-07-27 (×2): qty 2

## 2017-07-27 MED ORDER — WITCH HAZEL-GLYCERIN EX PADS: 1.0000 "application " | MEDICATED_PAD | CUTANEOUS | Status: DC | PRN

## 2017-07-27 MED ORDER — EPHEDRINE 5 MG/ML INJ: 10.0000 mg | INTRAVENOUS | Status: DC | PRN

## 2017-07-27 MED ORDER — LACTATED RINGERS IV SOLN
500.0000 mL | Freq: Once | INTRAVENOUS | Status: AC
  Administered 2017-07-27: 500 mL via INTRAVENOUS

## 2017-07-27 MED ORDER — DIPHENHYDRAMINE HCL 50 MG/ML IJ SOLN: 12.5000 mg | INTRAMUSCULAR | Status: DC | PRN

## 2017-07-27 MED ORDER — FENTANYL 2.5 MCG/ML W/ROPIVACAINE 0.15% IN NS 100 ML EPIDURAL (ARMC)
EPIDURAL | Status: DC | PRN
  Administered 2017-07-27: 12 mL/h via EPIDURAL

## 2017-07-27 MED ORDER — LIDOCAINE-EPINEPHRINE (PF) 1.5 %-1:200000 IJ SOLN
INTRAMUSCULAR | Status: DC | PRN
  Administered 2017-07-27: 3 mL via EPIDURAL

## 2017-07-27 MED ORDER — MEASLES, MUMPS & RUBELLA VAC ~~LOC~~ INJ
0.5000 mL | INJECTION | Freq: Once | SUBCUTANEOUS | Status: AC
  Administered 2017-07-29: 0.5 mL via SUBCUTANEOUS
  Filled 2017-07-27 (×2): qty 0.5

## 2017-07-27 MED ORDER — PHENYLEPHRINE 40 MCG/ML (10ML) SYRINGE FOR IV PUSH (FOR BLOOD PRESSURE SUPPORT): 80.0000 ug | PREFILLED_SYRINGE | INTRAVENOUS | Status: DC | PRN

## 2017-07-27 MED ORDER — ONDANSETRON HCL 4 MG/2ML IJ SOLN: 4.0000 mg | INTRAMUSCULAR | Status: DC | PRN

## 2017-07-27 MED ORDER — LIDOCAINE HCL (PF) 1 % IJ SOLN
INTRAMUSCULAR | Status: DC | PRN
  Administered 2017-07-27: 3 mL via SUBCUTANEOUS

## 2017-07-27 MED ORDER — FENTANYL 2.5 MCG/ML W/ROPIVACAINE 0.15% IN NS 100 ML EPIDURAL (ARMC)
12.0000 mL/h | EPIDURAL | Status: DC
  Filled 2017-07-27: qty 100

## 2017-07-27 MED ORDER — DIBUCAINE 1 % RE OINT: 1.0000 "application " | TOPICAL_OINTMENT | RECTAL | Status: DC | PRN

## 2017-07-27 MED ORDER — DIPHENHYDRAMINE HCL 25 MG PO CAPS: 25.0000 mg | ORAL_CAPSULE | Freq: Four times a day (QID) | ORAL | Status: DC | PRN

## 2017-07-27 MED ORDER — BENZOCAINE-MENTHOL 20-0.5 % EX AERO: 1.0000 "application " | INHALATION_SPRAY | CUTANEOUS | Status: DC | PRN

## 2017-07-27 MED ORDER — ZOLPIDEM TARTRATE 5 MG PO TABS: 5.0000 mg | ORAL_TABLET | Freq: Every evening | ORAL | Status: DC | PRN

## 2017-07-27 MED ORDER — ACETAMINOPHEN 325 MG PO TABS: 650.0000 mg | ORAL_TABLET | ORAL | Status: DC | PRN

## 2017-07-27 MED ORDER — TERBUTALINE SULFATE 1 MG/ML IJ SOLN
INTRAMUSCULAR | Status: AC
  Administered 2017-07-27: 0.25 mg via SUBCUTANEOUS
  Filled 2017-07-27: qty 1

## 2017-07-27 MED ORDER — BUPIVACAINE HCL (PF) 0.25 % IJ SOLN
INTRAMUSCULAR | Status: DC | PRN
  Administered 2017-07-27 (×2): 4 mL via EPIDURAL

## 2017-07-27 MED ORDER — IBUPROFEN 600 MG PO TABS
600.0000 mg | ORAL_TABLET | Freq: Four times a day (QID) | ORAL | Status: DC
  Administered 2017-07-27 – 2017-07-29 (×7): 600 mg via ORAL
  Filled 2017-07-27 (×7): qty 1

## 2017-07-27 MED ORDER — COCONUT OIL OIL
1.0000 "application " | TOPICAL_OIL | Status: DC | PRN
  Administered 2017-07-28: 1 via TOPICAL
  Filled 2017-07-27: qty 120

## 2017-07-27 MED ORDER — PRENATAL MULTIVITAMIN CH
1.0000 | ORAL_TABLET | Freq: Every day | ORAL | Status: DC
  Administered 2017-07-28: 1 via ORAL
  Filled 2017-07-27: qty 1

## 2017-07-27 MED ORDER — ONDANSETRON HCL 4 MG PO TABS
4.0000 mg | ORAL_TABLET | ORAL | Status: DC | PRN
Start: 2017-07-27 — End: 2017-07-29

## 2017-07-27 MED ORDER — SIMETHICONE 80 MG PO CHEW: 80.0000 mg | CHEWABLE_TABLET | ORAL | Status: DC | PRN

## 2017-07-27 NOTE — Progress Notes (Signed)
Lauren Mercer is a 26 y.o. G1P0 at 4649w5d by LMP admitted for induction of labor due to Hydramnios.  Subjective: Denies pain  Objective: BP 115/62   Pulse 98   Temp 98.1 F (36.7 C) (Oral)   Resp 19   Ht 5\' 3"  (1.6 m)   Wt 200 lb (90.7 kg)   LMP 10/15/2016   SpO2 100%   BMI 35.43 kg/m  I/O last 3 completed shifts: In: 1430.4 [P.O.:120; I.V.:1310.4] Out: -  No intake/output data recorded.  FHT:  FHR: 130 bpm, variability: moderate,  accelerations:  Present,  decelerations:  Present early MAD aware of FHR tracing. UC:   irregular, every 1-5 minutes SVE:   Dilation: Lip/rim Effacement (%): 100 Station: 0 Exam by:: M.Shambley, CNM  Labs: Lab Results  Component Value Date   WBC 11.5 (H) 07/26/2017   HGB 12.7 07/26/2017   HCT 37.4 07/26/2017   MCV 90.5 07/26/2017   PLT 208 07/26/2017    Assessment / Plan: IOL progressing well  Labor: Progressing normally Preeclampsia:  labs stable Fetal Wellbeing:  Category II Pain Control:  Epidural I/D:  n/a Anticipated MOD:  NSVD  Melody N Shambley 07/27/2017, 12:38 PM

## 2017-07-27 NOTE — Progress Notes (Signed)
Lauren Mercer is a 26 y.o. G1P0 at 4843w5d by LMP admitted for induction of labor due to Hydramnios.  Subjective: Desires epidural, rates pain a 7 on pain scale 0-10, breathing well through contractions  Objective: BP (!) 142/81   Pulse 80   Temp 98.1 F (36.7 C) (Oral)   Resp 19   Ht 5\' 3"  (1.6 m)   Wt 200 lb (90.7 kg)   LMP 10/15/2016   BMI 35.43 kg/m  I/O last 3 completed shifts: In: 1430.4 [P.O.:120; I.V.:1310.4] Out: -  No intake/output data recorded.  FHT:  FHR: 121 bpm, variability: minimal ,  accelerations:  Present,  decelerations:  Absent UC:   irregular, every 2-5 minutes SVE:   Dilation: 4 Effacement (%): 70, 80 Station: -1 Exam by:: M.Newton, RN  Labs: Lab Results  Component Value Date   WBC 11.5 (H) 07/26/2017   HGB 12.7 07/26/2017   HCT 37.4 07/26/2017   MCV 90.5 07/26/2017   PLT 208 07/26/2017    Assessment / Plan: IOL progresing well on Cytotec, will switch to pitocin this am.  Labor: Progressing normally Preeclampsia:  labs stable Fetal Wellbeing:  Category I Pain Control:  IV pain meds I/D:  n/a Anticipated MOD:  NSVD  Lauren Mercer N Lauren Mercer 07/27/2017, 7:49 AM

## 2017-07-27 NOTE — Anesthesia Procedure Notes (Signed)
Epidural  Start time: 07/27/2017 8:17 AM End time: 07/27/2017 8:30 AM  Staffing Anesthesiologist: Yevette EdwardsADAMS, JAMES G Resident/CRNA: Emmaly Leech Performed: resident/CRNA   Preanesthetic Checklist Completed: patient identified, site marked, surgical consent, pre-op evaluation, IV checked, risks and benefits discussed and monitors and equipment checked  Epidural Patient position: sitting Prep: ChloraPrep Patient monitoring: continuous pulse ox, blood pressure and heart rate Approach: midline Location: L3-L4 Injection technique: LOR air  Needle:  Needle type: Tuohy  Needle gauge: 17 G Needle length: 9 cm Needle insertion depth: 7 cm Catheter type: closed end flexible Catheter size: 19 Gauge Catheter at skin depth: 11 cm Test dose: 1.5% lidocaine with Epi 1:200 K  Assessment Events: blood not aspirated, injection not painful, no injection resistance, negative IV test and no paresthesia  Additional Notes Reason for block:procedure for pain

## 2017-07-27 NOTE — Progress Notes (Signed)
Lauren Mercer is a 26 y.o. G1P0 at 2653w5d by LMP admitted for induction of labor due to Hydramnios.  Subjective: Feeling some pressure with contractions since epidural placed  Objective: BP (!) 108/53   Pulse 83   Temp 98.1 F (36.7 C) (Oral)   Resp 19   Ht 5\' 3"  (1.6 m)   Wt 200 lb (90.7 kg)   LMP 10/15/2016   SpO2 100%   BMI 35.43 kg/m  I/O last 3 completed shifts: In: 1430.4 [P.O.:120; I.V.:1310.4] Out: -  No intake/output data recorded.  FHT:  FHR: 128 bpm, variability: moderate,  accelerations:  Present,  decelerations:  Present variable, early and late onset, not resolved with position change or O2, IUPC & FSE placed and amnioinfusion started-lates resolved. UC:   irregular, every 2-6 minutes SVE:   8.5/90/-2 with ROP presentation, moderate bloody show noted. Labs: Lab Results  Component Value Date   WBC 11.5 (H) 07/26/2017   HGB 12.7 07/26/2017   HCT 37.4 07/26/2017   MCV 90.5 07/26/2017   PLT 208 07/26/2017    Assessment / Plan: IOL progressing well IUPC & FSE placed and amnioinfusion started. Labor: Progressing normally Preeclampsia:  labs stable Fetal Wellbeing:  Category II Pain Control:  Epidural I/D:  n/a Anticipated MOD:  NSVD  Zaidee Rion N Talisa Petrak 07/27/2017, 11:36 AM

## 2017-07-27 NOTE — Anesthesia Preprocedure Evaluation (Signed)
Anesthesia Evaluation  Patient identified by MRN, date of birth, ID band Patient awake    Reviewed: Allergy & Precautions, H&P , NPO status , Patient's Chart, lab work & pertinent test results, reviewed documented beta blocker date and time   Airway Mallampati: II  TM Distance: >3 FB Neck ROM: full    Dental no notable dental hx. (+) Teeth Intact   Pulmonary neg pulmonary ROS, asthma , Current Smoker,    Pulmonary exam normal breath sounds clear to auscultation       Cardiovascular Exercise Tolerance: Good negative cardio ROS   Rhythm:regular Rate:Normal     Neuro/Psych negative neurological ROS  negative psych ROS   GI/Hepatic negative GI ROS, Neg liver ROS,   Endo/Other  negative endocrine ROSdiabetes  Renal/GU      Musculoskeletal   Abdominal   Peds  Hematology negative hematology ROS (+)   Anesthesia Other Findings   Reproductive/Obstetrics (+) Pregnancy                             Anesthesia Physical Anesthesia Plan  ASA: II  Anesthesia Plan: Epidural   Post-op Pain Management:    Induction:   PONV Risk Score and Plan:   Airway Management Planned:   Additional Equipment:   Intra-op Plan:   Post-operative Plan:   Informed Consent: I have reviewed the patients History and Physical, chart, labs and discussed the procedure including the risks, benefits and alternatives for the proposed anesthesia with the patient or authorized representative who has indicated his/her understanding and acceptance.     Plan Discussed with:   Anesthesia Plan Comments:         Anesthesia Quick Evaluation  

## 2017-07-28 LAB — CBC
HCT: 33.9 % — ABNORMAL LOW (ref 35.0–47.0)
Hemoglobin: 11.6 g/dL — ABNORMAL LOW (ref 12.0–16.0)
MCH: 31.1 pg (ref 26.0–34.0)
MCHC: 34.2 g/dL (ref 32.0–36.0)
MCV: 91.1 fL (ref 80.0–100.0)
PLATELETS: 160 10*3/uL (ref 150–440)
RBC: 3.72 MIL/uL — ABNORMAL LOW (ref 3.80–5.20)
RDW: 13.1 % (ref 11.5–14.5)
WBC: 14.6 10*3/uL — AB (ref 3.6–11.0)

## 2017-07-28 NOTE — Anesthesia Postprocedure Evaluation (Signed)
Anesthesia Post Note  Patient: Lauren Mercer  Procedure(s) Performed: * No procedures listed *  Patient location during evaluation: Mother Baby Anesthesia Type: Epidural Level of consciousness: awake and alert and oriented Pain management: pain level controlled Vital Signs Assessment: post-procedure vital signs reviewed and stable Respiratory status: respiratory function stable Cardiovascular status: stable Postop Assessment: no backache, no headache, patient able to bend at knees, no signs of nausea or vomiting, epidural receding and adequate PO intake Anesthetic complications: no     Last Vitals:  Vitals:   07/28/17 0005 07/28/17 0436  BP: (!) 114/50 113/73  Pulse: 85 84  Resp: 20 20  Temp: 36.7 C 37.1 C  SpO2: 100% 100%    Last Pain:  Vitals:   07/28/17 0720  TempSrc:   PainSc: 3                  Clydene PughBeane, Margerie Fraiser D

## 2017-07-28 NOTE — Progress Notes (Signed)
Post Partum Day 1 Subjective: no complaints, up ad lib and voiding  Objective: Blood pressure 121/69, pulse 80, temperature 98 F (36.7 C), resp. rate 18, height 5\' 3"  (1.6 m), weight 200 lb (90.7 kg), last menstrual period 10/15/2016, SpO2 98 %, unknown if currently breastfeeding.  Physical Exam:  General: alert, cooperative and appears stated age Lochia: appropriate Uterine Fundus: firm DVT Evaluation: No evidence of DVT seen on physical exam. Negative Homan's sign.   Recent Labs  07/26/17 1628 07/28/17 0404  HGB 12.7 11.6*  HCT 37.4 33.9*    Assessment/Plan: Plan for discharge tomorrow and Breastfeeding Infant feeding Breast    LOS: 2 days   Lauren Mercer 07/28/2017, 2:21 PM

## 2017-07-29 MED ORDER — FUSION PLUS PO CAPS: 1.0000 | ORAL_CAPSULE | Freq: Every day | ORAL | 1 refills | Status: DC

## 2017-07-29 MED ORDER — VITAMIN D3 125 MCG (5000 UT) PO CAPS: 1.0000 | ORAL_CAPSULE | Freq: Every day | ORAL | 2 refills | Status: DC

## 2017-07-29 NOTE — Discharge Summary (Signed)
Physician Obstetric Discharge Summary  Patient ID: Lauren Mercer MRN: [id number] DOB/AGE: 07-27-1991 26 y.o.   Date of Admission: 07/26/2017  Date of Discharge:   Admitting Diagnosis: Induction of labor at [redacted]w[redacted]d  Secondary Diagnosis: Anemia in pregnancy  Mode of Delivery: normal spontaneous vaginal delivery     Discharge Diagnosis: No other diagnosis   Intrapartum Procedures: Atificial rupture of membranes and cytotec   Post partum procedures: rubella vaccine  Complications: none   Brief Hospital Course  [name] is a G1P1001 who had a SVD on 07/27/16;  for further details of this, please refer to the delivey note.  Patient had an uncomplicated postpartum course.  By time of discharge on PPD#2, her pain was controlled on oral pain medications; she had appropriate lochia and was ambulating, voiding without difficulty and tolerating regular diet.  She was deemed stable for discharge to home.       Labs: CBC Latest Ref Rng & Units 07/28/2017 07/26/2017 04/27/2017  WBC 3.6 - 11.0 K/uL 14.6(H) 11.5(H) -  Hemoglobin 12.0 - 16.0 g/dL 11.6(L) 12.7 11.1  Hematocrit 35.0 - 47.0 % 33.9(L) 37.4 33.8(L)  Platelets 150 - 440 K/uL 160 208 -   A POS  Physical exam:  Blood pressure (!) 116/59, pulse 72, temperature 97.8 F (36.6 C), temperature source Oral, resp. rate 18, height 5\' 3"  (1.6 m), weight 200 lb (90.7 kg), last menstrual period 10/15/2016, SpO2 100 %, unknown if currently breastfeeding. General: alert and no distress Lochia: appropriate Abdomen: soft, NT Uterine Fundus: firm Extremities: No evidence of DVT seen on physical exam. No lower extremity edema.  Discharge Instructions: Per After Visit Summary. Activity: Advance as tolerated. Pelvic rest for 6 weeks.  Also refer to After Visit Summary Diet: Regular Medications: Allergies as of 07/29/2017   No Known Allergies     Medication List    STOP taking these medications   ranitidine 75 MG tablet Commonly  known as:  ZANTAC 75     TAKE these medications   albuterol 108 (90 Base) MCG/ACT inhaler Commonly known as:  PROVENTIL HFA;VENTOLIN HFA Inhale 2 puffs into the lungs every 6 (six) hours as needed for wheezing or shortness of breath.   CITRANATAL HARMONY 27-1-260 MG Caps Take 1 capsule by mouth daily.   FUSION PLUS Caps Take 1 capsule by mouth daily.   Vitamin D3 5000 units Caps Take 1 capsule (5,000 Units total) by mouth daily.      Outpatient follow up:  Postpartum contraception: Nexplanon  Discharged Condition: good  Discharged to: home   Newborn Data: Disposition:home with mother  Apgars: APGAR (1 MIN): 8   APGAR (5 MINS): 9   APGAR (10 MINS):    Baby Feeding: Breast  Melody Suzan Nailer, CNM

## 2017-07-29 NOTE — Progress Notes (Signed)
Patient discharged home with infant and significant other. Discharge instructions, prescriptions and follow up appointment given to and reviewed with patient and significant other. Patient verbalized understanding. Escorted out via wheelchair by auxiliary.  

## 2017-08-02 NOTE — Anesthesia Postprocedure Evaluation (Signed)
Anesthesia Post Note  Patient: Lauren Mercer  Procedure(s) Performed: * No procedures listed *  Patient location during evaluation: Mother Baby Anesthesia Type: Epidural Level of consciousness: awake and alert Pain management: pain level controlled Vital Signs Assessment: post-procedure vital signs reviewed and stable Respiratory status: spontaneous breathing, nonlabored ventilation and respiratory function stable Cardiovascular status: stable Postop Assessment: no headache, no backache and epidural receding Anesthetic complications: no     Last Vitals: There were no vitals filed for this visit.  Last Pain: There were no vitals filed for this visit.               Yevette Edwards

## 2017-08-18 ENCOUNTER — Encounter: Payer: Self-pay | Admitting: Obstetrics and Gynecology

## 2017-08-18 ENCOUNTER — Ambulatory Visit (INDEPENDENT_AMBULATORY_CARE_PROVIDER_SITE_OTHER): Payer: Medicaid Other | Admitting: Obstetrics and Gynecology

## 2017-08-18 DIAGNOSIS — Z30017 Encounter for initial prescription of implantable subdermal contraceptive: Secondary | ICD-10-CM | POA: Diagnosis not present

## 2017-08-18 MED ORDER — ETONOGESTREL 68 MG ~~LOC~~ IMPL: 68.0000 mg | DRUG_IMPLANT | Freq: Once | SUBCUTANEOUS | Status: DC

## 2017-08-18 NOTE — Progress Notes (Signed)
Subjective:     Lauren Mercer is a 26 y.o. female who presents for a postpartum visit. She is 4 weeks postpartum following a spontaneous vaginal delivery. I have fully reviewed the prenatal and intrapartum course. The delivery was at 39 gestational weeks. Outcome: spontaneous vaginal delivery. Anesthesia: epidural. Postpartum course has been uncomplicated and patient desires work release note-works at [employer] 20+ hours a week. Baby's course has been uncomplicated. Baby is feeding by switched to formula 2 weeks ago. Bleeding no bleeding. Bowel function is normal. Bladder function is normal. Patient is not sexually active. Contraception method is abstinence. Postpartum depression screening: negative.  The following portions of the patient's history were reviewed and updated as appropriate: allergies, current medications, past family history, past medical history, past social history, past surgical history and problem list.  Review of Systems A comprehensive review of systems was negative.   Objective:    BP 120/86   Pulse 88   Ht 5\' 3"  (1.6 m)   Wt 177 lb 12.8 oz (80.6 kg)   LMP 07/27/2017   BMI 31.50 kg/m   General:  alert, cooperative and appears stated age   Breasts:  inspection negative, no nipple discharge or bleeding, no masses or nodularity palpable  Lungs: clear to auscultation bilaterally  Heart:  regular rate and rhythm, S1, S2 normal, no murmur, click, rub or gallop  Abdomen: soft, non-tender; bowel sounds normal; no masses,  no organomegaly   Vulva:  normal  Vagina: normal vagina, no discharge, exudate, lesion, or erythema  Cervix:  multiparous appearance  Corpus: normal size, contour, position, consistency, mobility, non-tender  Adnexa:  no mass, fullness, tenderness  Rectal Exam: Not performed.        Assessment:     4 weeks postpartum exam.  Nexplanon insertion Pap smear not done at today's visit.   Plan:    1. Contraception: Nexplanon 2. Labs obtained will  follow up accordingly 3. Follow up in: 3 months for nexplanon check and pap; or as needed.       Lauren Mercer is a 26 y.o. year old Caucasian female here for Nexplanon insertion.  Patient's last menstrual period was 07/27/2017., last sexual intercourse was before delivery of infant, and her pregnancy test today was negative.  Risks/benefits/side effects of Nexplanon have been discussed and her questions have been answered.  Specifically, a failure rate of 12/998 has been reported, with an increased failure rate if pt takes St. John's Wort and/or antiseizure medicaitons.  Lauren Mercer is aware of the common side effect of irregular bleeding, which the incidence of decreases over time.  BP 120/86   Pulse 88   Ht 5\' 3"  (1.6 m)   Wt 177 lb 12.8 oz (80.6 kg)   LMP 07/27/2017   BMI 31.50 kg/m   No results found for this or any previous visit (from the past 24 hour(s)).   She is right-handed, so her right arm, approximately 4 inches proximal from the elbow, was cleansed with alcohol and anesthetized with 2cc of 2% Lidocaine.  The area was cleansed again with betadine and the Nexplanon was inserted per manufacturer's recommendations without difficulty.  A steri-strip and pressure bandage were applied.  Pt was instructed to keep the area clean and dry, remove pressure bandage in 24 hours, and keep insertion site covered with the steri-strip for 3-5 days.  Back up contraception was recommended for 2 weeks.  She was given a card indicating date Nexplanon was inserted and date it  needs to be removed. Follow-up PRN problems.  Melody Shambley,CNM

## 2017-08-18 NOTE — Patient Instructions (Addendum)
  Place postpartum visit patient instructions here.   Nexplanon Instructions After Insertion   Keep bandage clean and dry for 24 hours   May use ice/Tylenol/Ibuprofen for soreness or pain   If you develop fever, drainage or increased warmth from incision site-contact office immediately

## 2017-08-19 LAB — FERRITIN: Ferritin: 99 ng/mL (ref 15–150)

## 2017-08-19 LAB — CBC
HEMOGLOBIN: 13.9 g/dL (ref 11.1–15.9)
Hematocrit: 42.6 % (ref 34.0–46.6)
MCH: 29.8 pg (ref 26.6–33.0)
MCHC: 32.6 g/dL (ref 31.5–35.7)
MCV: 91 fL (ref 79–97)
Platelets: 296 10*3/uL (ref 150–379)
RBC: 4.67 x10E6/uL (ref 3.77–5.28)
RDW: 12.2 % — ABNORMAL LOW (ref 12.3–15.4)
WBC: 6.7 10*3/uL (ref 3.4–10.8)

## 2017-08-19 LAB — VITAMIN D 25 HYDROXY (VIT D DEFICIENCY, FRACTURES): Vit D, 25-Hydroxy: 42.7 ng/mL (ref 30.0–100.0)

## 2017-09-01 ENCOUNTER — Encounter: Payer: Medicaid Other | Admitting: Obstetrics and Gynecology

## 2017-10-10 ENCOUNTER — Encounter: Payer: Self-pay | Admitting: Obstetrics and Gynecology

## 2017-10-20 ENCOUNTER — Encounter: Payer: Self-pay | Admitting: Obstetrics and Gynecology

## 2017-10-20 ENCOUNTER — Ambulatory Visit (INDEPENDENT_AMBULATORY_CARE_PROVIDER_SITE_OTHER): Payer: Medicaid Other | Admitting: Obstetrics and Gynecology

## 2017-10-20 VITALS — BP 111/66 | HR 68 | Ht 63.0 in | Wt 180.7 lb

## 2017-10-20 DIAGNOSIS — G44211 Episodic tension-type headache, intractable: Secondary | ICD-10-CM

## 2017-10-20 DIAGNOSIS — Z975 Presence of (intrauterine) contraceptive device: Secondary | ICD-10-CM

## 2017-10-20 DIAGNOSIS — F53 Postpartum depression: Secondary | ICD-10-CM

## 2017-10-20 DIAGNOSIS — Z978 Presence of other specified devices: Secondary | ICD-10-CM

## 2017-10-20 DIAGNOSIS — N921 Excessive and frequent menstruation with irregular cycle: Secondary | ICD-10-CM

## 2017-10-20 DIAGNOSIS — O99345 Other mental disorders complicating the puerperium: Principal | ICD-10-CM

## 2017-10-20 MED ORDER — MEDROXYPROGESTERONE ACETATE 150 MG/ML IM SUSP: 150.0000 mg | INTRAMUSCULAR | 4 refills | Status: DC

## 2017-10-20 MED ORDER — FLUOXETINE HCL 10 MG PO CAPS: 10.0000 mg | ORAL_CAPSULE | Freq: Every day | ORAL | 3 refills | Status: DC

## 2017-10-20 MED ORDER — BUTALBITAL-APAP-CAFFEINE 50-325-40 MG PO CAPS: 1.0000 | ORAL_CAPSULE | Freq: Four times a day (QID) | ORAL | 3 refills | Status: DC | PRN

## 2017-10-20 MED ORDER — MEDROXYPROGESTERONE ACETATE 150 MG/ML IM SUSP
150.0000 mg | Freq: Once | INTRAMUSCULAR | Status: AC
  Administered 2017-10-20: 150 mg via INTRAMUSCULAR

## 2017-10-20 NOTE — Patient Instructions (Signed)
Postpartum Depression and Baby Blues The postpartum period begins right after the birth of a baby. During this time, there is often a great amount of joy and excitement. It is also a time of many changes in the life of the parents. Regardless of how many times a mother gives birth, each child brings new challenges and dynamics to the family. It is not unusual to have feelings of excitement along with confusing shifts in moods, emotions, and thoughts. All mothers are at risk of developing postpartum depression or the "baby blues." These mood changes can occur right after giving birth, or they may occur many months after giving birth. The baby blues or postpartum depression can be mild or severe. Additionally, postpartum depression can go away rather quickly, or it can be a long-term condition. What are the causes? Raised hormone levels and the rapid drop in those levels are thought to be a main cause of postpartum depression and the baby blues. A number of hormones change during and after pregnancy. Estrogen and progesterone usually decrease right after the delivery of your baby. The levels of thyroid hormone and various cortisol steroids also rapidly drop. Other factors that play a role in these mood changes include major life events and genetics. What increases the risk? If you have any of the following risks for the baby blues or postpartum depression, know what symptoms to watch out for during the postpartum period. Risk factors that may increase the likelihood of getting the baby blues or postpartum depression include:  Having a personal or family history of depression.  Having depression while being pregnant.  Having premenstrual mood issues or mood issues related to oral contraceptives.  Having a lot of life stress.  Having marital conflict.  Lacking a social support network.  Having a baby with special needs.  Having health problems, such as diabetes.  What are the signs or  symptoms? Symptoms of baby blues include:  Brief changes in mood, such as going from extreme happiness to sadness.  Decreased concentration.  Difficulty sleeping.  Crying spells, tearfulness.  Irritability.  Anxiety.  Symptoms of postpartum depression typically begin within the first month after giving birth. These symptoms include:  Difficulty sleeping or excessive sleepiness.  Marked weight loss.  Agitation.  Feelings of worthlessness.  Lack of interest in activity or food.  Postpartum psychosis is a very serious condition and can be dangerous. Fortunately, it is rare. Displaying any of the following symptoms is cause for immediate medical attention. Symptoms of postpartum psychosis include:  Hallucinations and delusions.  Bizarre or disorganized behavior.  Confusion or disorientation.  How is this diagnosed? A diagnosis is made by an evaluation of your symptoms. There are no medical or lab tests that lead to a diagnosis, but there are various questionnaires that a health care provider may use to identify those with the baby blues, postpartum depression, or psychosis. Often, a screening tool called the Edinburgh Postnatal Depression Scale is used to diagnose depression in the postpartum period. How is this treated? The baby blues usually goes away on its own in 1-2 weeks. Social support is often all that is needed. You will be encouraged to get adequate sleep and rest. Occasionally, you may be given medicines to help you sleep. Postpartum depression requires treatment because it can last several months or longer if it is not treated. Treatment may include individual or group therapy, medicine, or both to address any social, physiological, and psychological factors that may play a role in the   depression. Regular exercise, a healthy diet, rest, and social support may also be strongly recommended. Postpartum psychosis is more serious and needs treatment right away.  Hospitalization is often needed. Follow these instructions at home:  Get as much rest as you can. Nap when the baby sleeps.  Exercise regularly. Some women find yoga and walking to be beneficial.  Eat a balanced and nourishing diet.  Do little things that you enjoy. Have a cup of tea, take a bubble bath, read your favorite magazine, or listen to your favorite music.  Avoid alcohol.  Ask for help with household chores, cooking, grocery shopping, or running errands as needed. Do not try to do everything.  Talk to people close to you about how you are feeling. Get support from your partner, family members, friends, or other new moms.  Try to stay positive in how you think. Think about the things you are grateful for.  Do not spend a lot of time alone.  Only take over-the-counter or prescription medicine as directed by your health care provider.  Keep all your postpartum appointments.  Let your health care provider know if you have any concerns. Contact a health care provider if: You are having a reaction to or problems with your medicine. Get help right away if:  You have suicidal feelings.  You think you may harm the baby or someone else. This information is not intended to replace advice given to you by your health care provider. Make sure you discuss any questions you have with your health care provider. Document Released: 09/02/2004 Document Revised: 05/06/2016 Document Reviewed: 09/10/2013 Elsevier Interactive Patient Education  2017 [employer]  

## 2017-10-20 NOTE — Progress Notes (Signed)
Subjective:     Patient ID: Lauren Mercer, female   DOB: 05/21/1991, 26 y.o.   MRN: [id number]  HPI Desires taking Nexplanon out and switching back to Depo. Has been bleeding daily since insertion.  Also depression is worse and is now open to trying medication for it. REPORTS BEING TEARFUL ALL THE TIME, WANTING TO SLEEP ALL DAY, EASILY AGGITATED.  [facility] 10/20/2017 08/18/2017  Decreased Interest 3 0  Down, Depressed, Hopeless 3 1  PHQ - 2 Score 6 1  Altered sleeping 3 0  Tired, decreased energy 3 1  Change in appetite 3 0  Feeling bad or failure about yourself  1 -  Trouble concentrating 1 0  Moving slowly or fidgety/restless 0 0  Suicidal thoughts 0 0  PHQ-9 Score 17 2  Difficult doing work/chores Extremely dIfficult Not difficult at all    lastley is having bad headaches every couple days. excedrine helps a little.  Review of Systems    negative except stated above in HPI Objective:   Physical Exam A&Ox4 Well groomed but tearful Blood pressure 111/66, pulse 68, height 5\' 3"  (1.6 m), weight 180 lb 11.2 oz (82 kg), not currently breastfeeding.      Assessment:     BTB with Nexplanon-removal Headaches Postpartum depression    Plan:     Switch to depo- first injection given today Start on Prozac 10mg  daily- will recheck in 3 months RX for fiorecet given to help with headaches. Counseled at length about depression, treatment and length of treatment. RTC in 3 months for med check and next depo.  Melody Shambley,CNM      Lauren Mercer is a 26 y.o. year old Caucasian female here for Implanon removal.  Patient given informed consent for removal of her Implanon.  BP 111/66   Pulse 68   Ht 5\' 3"  (1.6 m)   Wt 180 lb 11.2 oz (82 kg)   Breastfeeding? No Comment: bledding with nexplanon  BMI 32.01 kg/m   Appropriate time out taken. Implanon site identified.  Area prepped in usual sterile fashon. One cc of 2% lidocaine was used to anesthetize  the area at the distal end of the implant. A small stab incision was made right beside the implant on the distal portion.  The Implanon rod was grasped using hemostats and removed without difficulty.  There was less than 3 cc blood loss. There were no complications.  Steri-strips were applied over the small incision and a pressure bandage was applied.  The patient tolerated the procedure well.  She was instructed to keep the area clean and dry, remove pressure bandage in 24 hours, and keep insertion site covered with the steri-strip for 3-5 days.    Follow-up PRN problems.  Melody N Shambley,CNM

## 2017-11-04 ENCOUNTER — Encounter: Payer: Self-pay | Admitting: Emergency Medicine

## 2017-11-04 ENCOUNTER — Emergency Department
Admission: EM | Admit: 2017-11-04 | Discharge: 2017-11-04 | Disposition: A | Discharge status: Home / Self Care | Payer: Self-pay | Attending: Emergency Medicine | Admitting: Emergency Medicine

## 2017-11-04 DIAGNOSIS — Z79899 Other long term (current) drug therapy: Secondary | ICD-10-CM | POA: Insufficient documentation

## 2017-11-04 DIAGNOSIS — N12 Tubulo-interstitial nephritis, not specified as acute or chronic: Secondary | ICD-10-CM | POA: Insufficient documentation

## 2017-11-04 LAB — COMPREHENSIVE METABOLIC PANEL
ALT: 19 U/L (ref 14–54)
AST: 21 U/L (ref 15–41)
Albumin: 3.8 g/dL (ref 3.5–5.0)
Alkaline Phosphatase: 77 U/L (ref 38–126)
Anion gap: 11 (ref 5–15)
BUN: 9 mg/dL (ref 6–20)
CHLORIDE: 108 mmol/L (ref 101–111)
CO2: 17 mmol/L — AB (ref 22–32)
Calcium: 8.7 mg/dL — ABNORMAL LOW (ref 8.9–10.3)
Creatinine, Ser: 0.56 mg/dL (ref 0.44–1.00)
GFR calc Af Amer: >60 mL/min (ref >60)
Glucose, Bld: 97 mg/dL (ref 65–99)
POTASSIUM: 3.8 mmol/L (ref 3.5–5.1)
SODIUM: 136 mmol/L (ref 135–145)
Total Bilirubin: 1.1 mg/dL (ref 0.3–1.2)
Total Protein: 7.2 g/dL (ref 6.5–8.1)

## 2017-11-04 LAB — CBC
HEMATOCRIT: 37 % (ref 35.0–47.0)
Hemoglobin: 12.9 g/dL (ref 12.0–16.0)
MCH: 29.9 pg (ref 26.0–34.0)
MCHC: 34.8 g/dL (ref 32.0–36.0)
MCV: 85.8 fL (ref 80.0–100.0)
Platelets: 231 10*3/uL (ref 150–440)
RBC: 4.32 MIL/uL (ref 3.80–5.20)
RDW: 12 % (ref 11.5–14.5)
WBC: 19.9 10*3/uL — AB (ref 3.6–11.0)

## 2017-11-04 LAB — URINALYSIS, COMPLETE (UACMP) WITH MICROSCOPIC
BILIRUBIN URINE: NEGATIVE
Glucose, UA: NEGATIVE mg/dL
KETONES UR: 5 mg/dL — AB
Nitrite: POSITIVE — AB
Protein, ur: 100 mg/dL — AB
Specific Gravity, Urine: 1.018 (ref 1.005–1.030)
pH: 5 (ref 5.0–8.0)

## 2017-11-04 LAB — POCT PREGNANCY, URINE: PREG TEST UR: NEGATIVE

## 2017-11-04 LAB — LIPASE, BLOOD: LIPASE: 22 U/L (ref 11–51)

## 2017-11-04 MED ORDER — DEXTROSE 5 % IV SOLN: 1.0000 g | Freq: Once | INTRAVENOUS | Status: DC

## 2017-11-04 MED ORDER — CEFTRIAXONE SODIUM IN DEXTROSE 20 MG/ML IV SOLN
INTRAVENOUS | Status: AC
  Administered 2017-11-04: 1000 mg
  Filled 2017-11-04: qty 50

## 2017-11-04 MED ORDER — CIPROFLOXACIN HCL 500 MG PO TABS: 500.0000 mg | ORAL_TABLET | Freq: Two times a day (BID) | ORAL | 0 refills | Status: AC

## 2017-11-04 MED ORDER — SODIUM CHLORIDE 0.9 % IV BOLUS (SEPSIS)
1000.0000 mL | Freq: Once | INTRAVENOUS | Status: AC
  Administered 2017-11-04: 1000 mL via INTRAVENOUS

## 2017-11-04 NOTE — ED Provider Notes (Signed)
[facility] Emergency Department Provider Note   ____________________________________________    I have reviewed the triage vital signs and the nursing notes.   HISTORY  Chief Complaint Abdominal Pain and Diarrhea     HPI Lauren Mercer is a 26 y.o. female who presents with complaints of mild abdominal pain, dysuria and some pain in her right back which she describes as aching in nature.  Several days ago she thought she might be developing a urinary tract infection so she got Azo and has been taking it but has not had significant improvement.  She denies fevers or chills.  No nausea.  She has not taken any antibiotics for this.  She does have a history of urinary tract infection/kidney infections in the past.  Previously she has been treated at home.  She does have a 3755-month-old but is not breast-feeding.   Past Medical History:  Diagnosis Date  . Amenorrhea 11/30/2016  . Asthma   . Medical history non-contributory     There are no active problems to display for this patient.   Past Surgical History:  Procedure Laterality Date  . NO PAST SURGERIES      Prior to Admission medications   Medication Sig Start Date End Date Taking? Authorizing Provider  albuterol (PROVENTIL HFA;VENTOLIN HFA) 108 (90 Base) MCG/ACT inhaler Inhale 2 puffs into the lungs every 6 (six) hours as needed for wheezing or shortness of breath. Patient not taking: Reported on 10/20/2017 01/19/17   Purcell NailsShambley, Melody N, CNM  Butalbital-APAP-Caffeine 50-325-40 MG capsule Take 1-2 capsules every 6 (six) hours as needed by mouth for headache. 10/20/17   Shambley, Melody N, CNM  ciprofloxacin (CIPRO) 500 MG tablet Take 1 tablet (500 mg total) by mouth 2 (two) times daily for 10 days. 11/04/17 11/14/17  Jene EveryKinner, Laasia Arcos, MD  FLUoxetine (PROZAC) 10 MG capsule Take 1 capsule (10 mg total) daily by mouth. 10/20/17   Shambley, Melody N, CNM  medroxyPROGESTERone (DEPO-PROVERA) 150 MG/ML  injection Inject 1 mL (150 mg total) every 3 (three) months into the muscle. 10/20/17   Purcell NailsShambley, Melody N, CNM     Allergies Patient has no known allergies.  Family History  Problem Relation Age of Onset  . Diabetes Maternal Uncle     Social History Social History   Tobacco Use  . Smoking status: Never Smoker  . Smokeless tobacco: Never Used  Substance Use Topics  . Alcohol use: No  . Drug use: No    Review of Systems  Constitutional: No fever/chills Eyes: No visual changes.  ENT: No sore throat. Cardiovascular: Denies chest pain. Respiratory: Denies shortness of breath. Gastrointestinal: No abdominal pain.  No nausea, no vomiting.   Genitourinary: Positive for dysuria, no vaginal discharge Musculoskeletal: Negative for back pain. Skin: Negative for rash. Neurological: Negative for headaches or weakness   ____________________________________________   PHYSICAL EXAM:  VITAL SIGNS: ED Triage Vitals  Enc Vitals Group     BP 11/04/17 1943 124/63     Pulse Rate 11/04/17 1943 (!) 119     Resp 11/04/17 1943 16     Temp 11/04/17 1943 98.6 F (37 C)     Temp Source 11/04/17 1943 Oral     SpO2 11/04/17 1943 94 %     Weight 11/04/17 1944 81.6 kg (180 lb)     Height 11/04/17 1944 1.6 m (5\' 3" )     Head Circumference --      Peak Flow --  Pain Score 11/04/17 1954 8     Pain Loc --      Pain Edu? --      Excl. in GC? --     Constitutional: Alert and oriented. No acute distress. Pleasant and interactive Eyes: Conjunctivae are normal.  Head: Atraumatic. Nose: No congestion/rhinnorhea. Mouth/Throat: Mucous membranes are moist.    Cardiovascular: Tachycardia, regular rhythm. Grossly normal heart sounds.  Good peripheral circulation. Respiratory: Normal respiratory effort.  No retractions. Lungs CTAB. Gastrointestinal: Soft and nontender. No distention.  No significant CVA tenderness Genitourinary: deferred Musculoskeletal:   Warm and well perfused Neurologic:   Normal speech and language. No gross focal neurologic deficits are appreciated.  Skin:  Skin is warm, dry and intact. No rash noted. Psychiatric: Mood and affect are normal. Speech and behavior are normal.  ____________________________________________   LABS (all labs ordered are listed, but only abnormal results are displayed)  Labs Reviewed  COMPREHENSIVE METABOLIC PANEL - Abnormal; Notable for the following components:      Result Value   CO2 17 (*)    Calcium 8.7 (*)    All other components within normal limits  CBC - Abnormal; Notable for the following components:   WBC 19.9 (*)    All other components within normal limits  URINALYSIS, COMPLETE (UACMP) WITH MICROSCOPIC - Abnormal; Notable for the following components:   Color, Urine YELLOW (*)    APPearance CLOUDY (*)    Hgb urine dipstick SMALL (*)    Ketones, ur 5 (*)    Protein, ur 100 (*)    Nitrite POSITIVE (*)    Leukocytes, UA LARGE (*)    Bacteria, UA MANY (*)    Squamous Epithelial / LPF 0-5 (*)    All other components within normal limits  LIPASE, BLOOD  POCT PREGNANCY, URINE  POC URINE PREG, ED   ____________________________________________  EKG  None ____________________________________________  RADIOLOGY  None ____________________________________________   PROCEDURES  Procedure(s) performed: No  Procedures   Critical Care performed: No ____________________________________________   INITIAL IMPRESSION / ASSESSMENT AND PLAN / ED COURSE  Pertinent labs & imaging results that were available during my care of the patient were reviewed by me and considered in my medical decision making (see chart for details).  Patient well-appearing in no acute distress.  Tachycardia noted on exam but otherwise she is quite comfortable.  Lab work is significant for elevated white blood cell count likely caused by urinalysis consistent with UTI/Pilo.  We will treat with IV Rocephin, IV fluids and recheck  vital signs and reevaluate.  ----------------------------------------- 10:03 PM on 11/04/2017 -----------------------------------------  Offered admission but patient greatly prefers to go home as she has a 6635-month-old at home, she reports she will come back immediately if she feels any worse.  She feels significantly better after treatment.  Heart rate has improved and she is well-appearing.  She is not breast-feeding.  Strict return precautions    ____________________________________________   FINAL CLINICAL IMPRESSION(S) / ED DIAGNOSES  Final diagnoses:  Pyelonephritis        Note:  This document was prepared using Dragon voice recognition software and may include unintentional dictation errors.    Jene EveryKinner, Sultana Tierney, MD 11/04/17 2204

## 2017-11-04 NOTE — ED Triage Notes (Signed)
Patient states she had a bug bite on her upper leg and she lanced it at home, meanwhile having some dysuria.  She stated she started taking Azo because she thought she might have a UTI.  She had some bilateral flank pain Monday and Tuesday this week but that is now gone and since Wednesday has had diarrhea and nausea with abdominal pain, but no vomiting.

## 2017-11-04 NOTE — ED Notes (Signed)
Tender to palpation of bilateral flank

## 2017-11-04 NOTE — ED Notes (Signed)
Patient reports possible spider bite 2 weeks ago. Patient reports nausea, abdominal pain, flank pain, fever (tmax 101.5), and diarrhea starting Wednesday.

## 2017-11-04 NOTE — ED Notes (Signed)
Reviewed d/c instructions, follow-up care, and prescription with patient. Patient verbalized understanding.

## 2017-11-04 NOTE — ED Notes (Signed)
ED Provider at bedside. 

## 2017-11-17 ENCOUNTER — Encounter: Payer: Medicaid Other | Admitting: Obstetrics and Gynecology

## 2018-01-12 ENCOUNTER — Encounter: Payer: Medicaid Other | Admitting: Obstetrics and Gynecology

## 2018-02-23 ENCOUNTER — Other Ambulatory Visit: Payer: Self-pay | Admitting: *Deleted

## 2018-02-23 ENCOUNTER — Encounter: Payer: Self-pay | Admitting: Obstetrics and Gynecology

## 2018-02-23 ENCOUNTER — Ambulatory Visit (INDEPENDENT_AMBULATORY_CARE_PROVIDER_SITE_OTHER): Payer: Medicaid Other | Admitting: Obstetrics and Gynecology

## 2018-02-23 VITALS — BP 117/80 | HR 72 | Ht 63.0 in | Wt 180.2 lb

## 2018-02-23 DIAGNOSIS — Z79899 Other long term (current) drug therapy: Secondary | ICD-10-CM

## 2018-02-23 DIAGNOSIS — Z3042 Encounter for surveillance of injectable contraceptive: Secondary | ICD-10-CM | POA: Diagnosis not present

## 2018-02-23 LAB — POCT URINE PREGNANCY: PREG TEST UR: NEGATIVE

## 2018-02-23 MED ORDER — MEDROXYPROGESTERONE ACETATE 150 MG/ML IM SUSP: 150.0000 mg | INTRAMUSCULAR | 4 refills | Status: DC

## 2018-02-23 MED ORDER — FLUOXETINE HCL 10 MG PO CAPS: 10.0000 mg | ORAL_CAPSULE | Freq: Every day | ORAL | 3 refills | Status: DC

## 2018-02-23 MED ORDER — MEDROXYPROGESTERONE ACETATE 150 MG/ML IM SUSP
150.0000 mg | Freq: Once | INTRAMUSCULAR | Status: AC
  Administered 2018-02-23: 150 mg via INTRAMUSCULAR

## 2018-02-23 NOTE — Progress Notes (Signed)
Subjective:     Patient ID: Lauren Mercer, female   DOB: [birth year], 27 y.o.   MRN: [id number]  HPI Here for medication management and depo injection. She is a month late getting the depo and reports spotting for a few days last week. Happy with depo overall. States headaches have subsided.  Also states depression much better. Feels like any worries or anxiety related to being a new mom and she is coping with them as others would. Desires to continue prozac. [facility] 02/23/2018 10/20/2017 08/18/2017  Decreased Interest 0 3 0  Down, Depressed, Hopeless 1 3 1   PHQ - 2 Score 1 6 1   Altered sleeping 1 3 0  Tired, decreased energy 1 3 1   Change in appetite 2 3 0  Feeling bad or failure about yourself  0 1 -  Trouble concentrating 0 1 0  Moving slowly or fidgety/restless 0 0 0  Suicidal thoughts 0 0 0  PHQ-9 Score 5 17 2   Difficult doing work/chores Not difficult at all Extremely dIfficult Not difficult at all   Review of Systems negative    Objective:   Physical Exam A&Ox4 Well groomed female in no distress. Smile and showing pictures of her 597 month old daughter. Blood pressure 117/80, pulse 72, height 5\' 3"  (1.6 m), weight 180 lb 3.2 oz (81.7 kg), not currently breastfeeding. UPT- No PE indicated.    Assessment:     Here for depo injection Medication management PPD under good control with current SSRI    Plan:     Depo given, will return in 3 months for next injection. Refilled prozac.  Melody Shambley,CNM

## 2018-03-29 ENCOUNTER — Encounter: Payer: Medicaid Other | Admitting: Obstetrics and Gynecology

## 2018-05-11 NOTE — Progress Notes (Deleted)
Date last pap: 10/14/15. Last Depo-Provera: 02-23-18. Side Effects if any: none. Serum HCG indicated? N/A. Depo-Provera 150 mg IM given by: FH. Next appointment due Aug 16-Aug 11, 2018

## 2018-05-12 ENCOUNTER — Ambulatory Visit: Payer: Medicaid Other

## 2018-05-12 ENCOUNTER — Telehealth: Payer: Self-pay | Admitting: Obstetrics and Gynecology

## 2018-05-12 NOTE — Telephone Encounter (Signed)
The patient called to make sure her depo injection was ready at her pharmacy. Please advise.

## 2018-05-12 NOTE — Telephone Encounter (Signed)
Called pt she should have rx at pharmacy

## 2018-05-16 ENCOUNTER — Ambulatory Visit (INDEPENDENT_AMBULATORY_CARE_PROVIDER_SITE_OTHER): Payer: Medicaid Other | Admitting: Obstetrics and Gynecology

## 2018-05-16 VITALS — BP 122/84 | HR 76 | Wt 178.5 lb

## 2018-05-16 DIAGNOSIS — Z3042 Encounter for surveillance of injectable contraceptive: Secondary | ICD-10-CM | POA: Diagnosis not present

## 2018-05-16 MED ORDER — MEDROXYPROGESTERONE ACETATE 150 MG/ML IM SUSP
150.0000 mg | Freq: Once | INTRAMUSCULAR | Status: AC
  Administered 2018-05-16: 150 mg via INTRAMUSCULAR

## 2018-05-16 NOTE — Progress Notes (Signed)
Date last pap: na Last Depo-Provera: 02/23/18 Side Effects if any: na Serum HCG indicated? na Depo-Provera 150 mg IM given by: Rosine BeatAmy Elma Limas, CMA Next appointment due -Aug 29-Sept 12th BP 122/84   Pulse 76   Wt 178 lb 8 oz (81 kg)   BMI 31.62 kg/m

## 2018-08-15 ENCOUNTER — Ambulatory Visit (INDEPENDENT_AMBULATORY_CARE_PROVIDER_SITE_OTHER): Payer: Medicaid Other | Admitting: Obstetrics and Gynecology

## 2018-08-15 VITALS — BP 122/70 | HR 77 | Ht 63.0 in | Wt 188.7 lb

## 2018-08-15 DIAGNOSIS — Z3049 Encounter for surveillance of other contraceptives: Secondary | ICD-10-CM | POA: Diagnosis not present

## 2018-08-15 DIAGNOSIS — Z3042 Encounter for surveillance of injectable contraceptive: Secondary | ICD-10-CM

## 2018-08-15 MED ORDER — MEDROXYPROGESTERONE ACETATE 150 MG/ML IM SUSP
150.0000 mg | Freq: Once | INTRAMUSCULAR | Status: AC
  Administered 2018-08-15: 150 mg via INTRAMUSCULAR

## 2018-08-15 NOTE — Progress Notes (Signed)
Date last pap: na Last Depo-Provera: 05/16/18 Side Effects if any: na Serum HCG indicated? na Depo-Provera 150 mg IM given by: Rosine Beat, CMA Next appointment due Nov.19-Dec 3,2019 BP 122/70   Pulse 77   Ht 5\' 3"  (1.6 m)   Wt 188 lb 11.2 oz (85.6 kg)   BMI 33.43 kg/m

## 2018-10-31 ENCOUNTER — Ambulatory Visit: Payer: Medicaid Other

## 2018-11-06 NOTE — Progress Notes (Signed)
Date last pap: NA Last Depo-Provera:08/15/18. Side Effects if any: Weight Gain Serum HCG indicated? NA Depo-Provera 150 mg IM given by: [facility] CMA Next appointment due 01/23/19 thru 02/06/19  BP 115/75   Pulse 81   Ht 5\' 3"  (1.6 m)   Wt 192 lb 11.2 oz (87.4 kg)   BMI 34.14 kg/m

## 2018-11-07 ENCOUNTER — Ambulatory Visit (INDEPENDENT_AMBULATORY_CARE_PROVIDER_SITE_OTHER): Payer: Medicaid Other | Admitting: Obstetrics and Gynecology

## 2018-11-07 VITALS — BP 115/75 | HR 81 | Ht 63.0 in | Wt 192.7 lb

## 2018-11-07 DIAGNOSIS — Z3042 Encounter for surveillance of injectable contraceptive: Secondary | ICD-10-CM | POA: Diagnosis not present

## 2018-11-07 MED ORDER — MEDROXYPROGESTERONE ACETATE 150 MG/ML IM SUSP
150.0000 mg | Freq: Once | INTRAMUSCULAR | Status: AC
  Administered 2018-11-07: 150 mg via INTRAMUSCULAR

## 2018-11-26 ENCOUNTER — Emergency Department: Payer: Self-pay

## 2018-11-26 ENCOUNTER — Other Ambulatory Visit: Payer: Self-pay

## 2018-11-26 ENCOUNTER — Emergency Department
Admission: EM | Admit: 2018-11-26 | Discharge: 2018-11-26 | Disposition: A | Discharge status: Home / Self Care | Payer: Self-pay | Attending: Emergency Medicine | Admitting: Emergency Medicine

## 2018-11-26 DIAGNOSIS — R0789 Other chest pain: Secondary | ICD-10-CM | POA: Insufficient documentation

## 2018-11-26 DIAGNOSIS — J45909 Unspecified asthma, uncomplicated: Secondary | ICD-10-CM | POA: Insufficient documentation

## 2018-11-26 DIAGNOSIS — B9789 Other viral agents as the cause of diseases classified elsewhere: Secondary | ICD-10-CM | POA: Insufficient documentation

## 2018-11-26 DIAGNOSIS — Z79899 Other long term (current) drug therapy: Secondary | ICD-10-CM | POA: Insufficient documentation

## 2018-11-26 DIAGNOSIS — J069 Acute upper respiratory infection, unspecified: Secondary | ICD-10-CM | POA: Insufficient documentation

## 2018-11-26 MED ORDER — NAPROXEN 500 MG PO TABS: 500.0000 mg | ORAL_TABLET | Freq: Two times a day (BID) | ORAL | 2 refills | Status: DC

## 2018-11-26 NOTE — ED Triage Notes (Signed)
A&O, ambulatory. No distress noted.   Cough and congestion. Coughed yesterday and felt L rib pain.

## 2018-11-26 NOTE — ED Provider Notes (Signed)
[facility] Emergency Department Provider Note   ____________________________________________    I have reviewed the triage vital signs and the nursing notes.   HISTORY  Chief Complaint Cough     HPI Lauren Mercer is a 27 y.o. female who presents with a cough.  Patient notes she had a head cold last week which is now developed into a dry nonproductive cough.  She denies shortness of breath but yesterday was she was coughing and felt something "pop ".  Feels better today.   Past Medical History:  Diagnosis Date  . Amenorrhea 11/30/2016  . Asthma   . Medical history non-contributory     There are no active problems to display for this patient.   Past Surgical History:  Procedure Laterality Date  . NO PAST SURGERIES      Prior to Admission medications   Medication Sig Start Date End Date Taking? Authorizing Provider  albuterol (PROVENTIL HFA;VENTOLIN HFA) 108 (90 Base) MCG/ACT inhaler Inhale 2 puffs into the lungs every 6 (six) hours as needed for wheezing or shortness of breath. 01/19/17   Shambley, Melody N, CNM  Butalbital-APAP-Caffeine 50-325-40 MG capsule Take 1-2 capsules every 6 (six) hours as needed by mouth for headache. 10/20/17   Shambley, Melody N, CNM  FLUoxetine (PROZAC) 10 MG capsule Take 1 capsule (10 mg total) by mouth daily. 02/23/18   Shambley, Melody N, CNM  medroxyPROGESTERone (DEPO-PROVERA) 150 MG/ML injection Inject 1 mL (150 mg total) into the muscle every 3 (three) months. 02/23/18   Shambley, Melody N, CNM  naproxen (NAPROSYN) 500 MG tablet Take 1 tablet (500 mg total) by mouth 2 (two) times daily with a meal. 11/26/18   Jene Every, MD     Allergies Patient has no known allergies.  Family History  Problem Relation Age of Onset  . Diabetes Maternal Uncle     Social History Social History   Tobacco Use  . Smoking status: Never Smoker  . Smokeless tobacco: Never Used  Substance Use Topics  . Alcohol use:  No  . Drug use: No    Review of Systems  Constitutional: No fever/chills  ENT: Had nasal congestion now better   Gastrointestinal: No abdominal pain.    Musculoskeletal: No myalgias Skin: Negative for rash.     ____________________________________________   PHYSICAL EXAM:  VITAL SIGNS: ED Triage Vitals [11/26/18 1717]  Enc Vitals Group     BP 140/64     Pulse Rate 78     Resp 16     Temp 99.3 F (37.4 C)     Temp Source Oral     SpO2 99 %     Weight 86.2 kg (190 lb)     Height 1.6 m (5\' 3" )     Head Circumference      Peak Flow      Pain Score 10     Pain Loc      Pain Edu?      Excl. in GC?      Constitutional: Alert and oriented. No acute distress.    Nose: No congestion/rhinnorhea. Mouth/Throat: Mucous membranes are moist.   Cardiovascular: Normal rate, regular rhythm.  No chest wall tenderness to palpation Respiratory: Normal respiratory effort.  No retractions.  Clear to auscultation bilaterally  Musculoskeletal: No lower extremity tenderness nor edema.   Neurologic:  Normal speech and language. No gross focal neurologic deficits are appreciated.   Skin:  Skin is warm, dry and intact. No rash noted.  ____________________________________________   LABS (all labs ordered are listed, but only abnormal results are displayed)  Labs Reviewed - No data to display ____________________________________________  EKG   ____________________________________________  RADIOLOGY   ____________________________________________   PROCEDURES  Procedure(s) performed: No  Procedures   Critical Care performed: No ____________________________________________   INITIAL IMPRESSION / ASSESSMENT AND PLAN / ED COURSE  Pertinent labs & imaging results that were available during my care of the patient were reviewed by me and considered in my medical decision making (see chart for details).  Patient well-appearing in no acute distress.  Exam is quite  reassuring, chest x-ray normal, no pneumothorax.  Recommend supportive care for viral upper respiratory infection   ____________________________________________   FINAL CLINICAL IMPRESSION(S) / ED DIAGNOSES  Final diagnoses:  Viral URI with cough  Chest wall pain      NEW MEDICATIONS STARTED DURING THIS VISIT:  Discharge Medication List as of 11/26/2018  6:51 PM    START taking these medications   Details  naproxen (NAPROSYN) 500 MG tablet Take 1 tablet (500 mg total) by mouth 2 (two) times daily with a meal., Starting Sun 11/26/2018, Normal         Note:  This document was prepared using Dragon voice recognition software and may include unintentional dictation errors.    Jene EveryKinner, Rodolfo Notaro, MD 11/26/18 2052

## 2019-01-26 ENCOUNTER — Ambulatory Visit (INDEPENDENT_AMBULATORY_CARE_PROVIDER_SITE_OTHER): Payer: Medicaid Other | Admitting: Certified Nurse Midwife

## 2019-01-26 VITALS — BP 100/60 | HR 70 | Ht 63.0 in | Wt 202.1 lb

## 2019-01-26 DIAGNOSIS — Z79899 Other long term (current) drug therapy: Secondary | ICD-10-CM

## 2019-01-26 DIAGNOSIS — Z3042 Encounter for surveillance of injectable contraceptive: Secondary | ICD-10-CM

## 2019-01-26 MED ORDER — MEDROXYPROGESTERONE ACETATE 150 MG/ML IM SUSP
150.0000 mg | Freq: Once | INTRAMUSCULAR | Status: AC
  Administered 2019-01-26: 150 mg via INTRAMUSCULAR

## 2019-01-26 NOTE — Progress Notes (Signed)
Date last pap: 10/15/15 Last Depo-Provera: 11/07/18 Side Effects if any: weight gain Serum HCG indicated? N/A Depo-Provera 150 mg IM given by: SLS Next appointment due 04/14/19-04/28/19   Annual exam also.

## 2019-03-30 ENCOUNTER — Encounter: Payer: Self-pay | Admitting: *Deleted

## 2019-03-30 ENCOUNTER — Other Ambulatory Visit: Payer: Self-pay

## 2019-03-30 ENCOUNTER — Emergency Department
Admission: EM | Admit: 2019-03-30 | Discharge: 2019-03-30 | Disposition: A | Discharge status: Home / Self Care | Payer: Medicaid Other | Attending: Emergency Medicine | Admitting: Emergency Medicine

## 2019-03-30 DIAGNOSIS — Y999 Unspecified external cause status: Secondary | ICD-10-CM | POA: Insufficient documentation

## 2019-03-30 DIAGNOSIS — R21 Rash and other nonspecific skin eruption: Secondary | ICD-10-CM | POA: Diagnosis not present

## 2019-03-30 DIAGNOSIS — Z79899 Other long term (current) drug therapy: Secondary | ICD-10-CM | POA: Insufficient documentation

## 2019-03-30 DIAGNOSIS — W57XXXA Bitten or stung by nonvenomous insect and other nonvenomous arthropods, initial encounter: Secondary | ICD-10-CM | POA: Insufficient documentation

## 2019-03-30 DIAGNOSIS — Y929 Unspecified place or not applicable: Secondary | ICD-10-CM | POA: Insufficient documentation

## 2019-03-30 DIAGNOSIS — S80861A Insect bite (nonvenomous), right lower leg, initial encounter: Secondary | ICD-10-CM | POA: Diagnosis not present

## 2019-03-30 DIAGNOSIS — J45909 Unspecified asthma, uncomplicated: Secondary | ICD-10-CM | POA: Diagnosis not present

## 2019-03-30 DIAGNOSIS — Y939 Activity, unspecified: Secondary | ICD-10-CM | POA: Insufficient documentation

## 2019-03-30 LAB — CBC WITH DIFFERENTIAL/PLATELET
Abs Immature Granulocytes: 0.05 10*3/uL (ref 0.00–0.07)
Basophils Absolute: 0.1 10*3/uL (ref 0.0–0.1)
Basophils Relative: 1 %
Eosinophils Absolute: 0.1 10*3/uL (ref 0.0–0.5)
Eosinophils Relative: 0 %
HCT: 41.5 % (ref 36.0–46.0)
Hemoglobin: 13.5 g/dL (ref 12.0–15.0)
Immature Granulocytes: 0 %
Lymphocytes Relative: 13 %
Lymphs Abs: 1.8 10*3/uL (ref 0.7–4.0)
MCH: 28.6 pg (ref 26.0–34.0)
MCHC: 32.5 g/dL (ref 30.0–36.0)
MCV: 87.9 fL (ref 80.0–100.0)
Monocytes Absolute: 0.6 10*3/uL (ref 0.1–1.0)
Monocytes Relative: 4 %
Neutro Abs: 11.2 10*3/uL — ABNORMAL HIGH (ref 1.7–7.7)
Neutrophils Relative %: 82 %
Platelets: 303 10*3/uL (ref 150–400)
RBC: 4.72 MIL/uL (ref 3.87–5.11)
RDW: 11.8 % (ref 11.5–15.5)
WBC: 13.8 10*3/uL — ABNORMAL HIGH (ref 4.0–10.5)
nRBC: 0 % (ref 0.0–0.2)

## 2019-03-30 MED ORDER — DOXYCYCLINE HYCLATE 100 MG PO CAPS: 100.0000 mg | ORAL_CAPSULE | Freq: Two times a day (BID) | ORAL | 0 refills | Status: DC

## 2019-03-30 NOTE — ED Provider Notes (Signed)
[facility] Emergency Department Provider Note  ____________________________________________  Time seen: Approximately 8:53 PM  I have reviewed the triage vital signs and the nursing notes.   HISTORY  Chief Complaint Insect Bite   HPI Lauren Mercer is a 28 y.o. female who presents to the emergency department for evaluation of a lesion to the right lower extremity.  She states that she pulled a small black tick off of her  midweek last week.  She noticed that the erythema and induration started last night.  When she awakened this morning, it was a "bull's-eye red rash."  No alleviating measures attempted prior to arrival.  She denies fever or other symptoms of concern.  Past Medical History:  Diagnosis Date  . Amenorrhea 11/30/2016  . Asthma   . Medical history non-contributory     There are no active problems to display for this patient.   Past Surgical History:  Procedure Laterality Date  . NO PAST SURGERIES      Prior to Admission medications   Medication Sig Start Date End Date Taking? Authorizing Provider  albuterol (PROVENTIL HFA;VENTOLIN HFA) 108 (90 Base) MCG/ACT inhaler Inhale 2 puffs into the lungs every 6 (six) hours as needed for wheezing or shortness of breath. 01/19/17   Shambley, Melody N, CNM  doxycycline (VIBRAMYCIN) 100 MG capsule Take 1 capsule (100 mg total) by mouth 2 (two) times daily. 03/30/19   Ugonna Keirsey B, FNP  FLUoxetine (PROZAC) 10 MG capsule Take 1 capsule (10 mg total) by mouth daily. 02/23/18   Shambley, Melody N, CNM  medroxyPROGESTERone (DEPO-PROVERA) 150 MG/ML injection Inject 1 mL (150 mg total) into the muscle every 3 (three) months. 02/23/18   Purcell Nails, CNM    Allergies Patient has no known allergies.  Family History  Problem Relation Age of Onset  . Diabetes Maternal Uncle     Social History Social History   Tobacco Use  . Smoking status: Never Smoker  . Smokeless tobacco: Never Used   Substance Use Topics  . Alcohol use: No  . Drug use: No    Review of Systems  Constitutional: Negative for fever. Respiratory: Negative for cough or shortness of breath.  Musculoskeletal: Negative for myalgias Skin: Positive for lesion to the right lower extremity Neurological: Negative for numbness or paresthesias. ____________________________________________   PHYSICAL EXAM:  VITAL SIGNS: ED Triage Vitals  Enc Vitals Group     BP 03/30/19 1744 136/84     Pulse Rate 03/30/19 1744 (!) 101     Resp 03/30/19 1744 16     Temp 03/30/19 1744 99.2 F (37.3 C)     Temp Source 03/30/19 1744 Oral     SpO2 03/30/19 1744 100 %     Weight 03/30/19 1741 200 lb (90.7 kg)     Height 03/30/19 1741 5\' 4"  (1.626 m)     Head Circumference --      Peak Flow --      Pain Score 03/30/19 1740 9     Pain Loc --      Pain Edu? --      Excl. in GC? --      Constitutional: Well appearing. Eyes: Conjunctivae are clear without discharge or drainage. Nose: No rhinorrhea noted. Mouth/Throat: Airway is patent.  Neck: No stridor. Unrestricted range of motion observed. Cardiovascular: Capillary refill is <3 seconds.  Respiratory: Respirations are even and unlabored.. Musculoskeletal: Unrestricted range of motion observed. Neurologic: Awake, alert, and oriented x 4.  Skin: Central  indurated area with annular area of clearing the measures approximately one 1 cm then annular erythema that is maculopapular.  The width of the erythematous area that surrounds the induration is approximately 1.5 cm.  ____________________________________________   LABS (all labs ordered are listed, but only abnormal results are displayed)  Labs Reviewed  CBC WITH DIFFERENTIAL/PLATELET - Abnormal; Notable for the following components:      Result Value   WBC 13.8 (*)    Neutro Abs 11.2 (*)    All other components within normal limits  LYME DISEASE, WESTERN BLOT  ROCKY MTN SPOTTED FVR ABS PNL(IGG+IGM)    ____________________________________________  EKG  Not indicated. ____________________________________________  RADIOLOGY  Not indicated ____________________________________________   PROCEDURES  Procedures ____________________________________________   INITIAL IMPRESSION / ASSESSMENT AND PLAN / ED COURSE  Lauren Mercer is a 28 y.o. female who presents to the emergency department for evaluation of a lesion to the right lower extremity in the location that she removed a tick last week.  Other than the surrounding erythema that is localized to the site, there has been no rash.  She denies fatigue, body aches, chills, or fever.  Labs will be drawn and sent for rule out of Lyme's and recommend spotted fever.  CBC will be sent to obtain a baseline.  She will be treated with doxycycline.  She was encouraged to follow-up with primary care provider of her choice if the area does not appear to be improving over the next few days.  She was encouraged to return here for symptoms of concern if she is unable to schedule an appointment.   Medications - No data to display   Pertinent labs & imaging results that were available during my care of the patient were reviewed by me and considered in my medical decision making (see chart for details).  ____________________________________________   FINAL CLINICAL IMPRESSION(S) / ED DIAGNOSES  Final diagnoses:  Tick bite of right lower leg, initial encounter  Rash    ED Discharge Orders         Ordered    doxycycline (VIBRAMYCIN) 100 MG capsule  2 times daily,   Status:  Discontinued     03/30/19 1921    doxycycline (VIBRAMYCIN) 100 MG capsule  2 times daily     03/30/19 1922           Note:  This document was prepared using Dragon voice recognition software and may include unintentional dictation errors.   Chinita Pesterriplett, Gunter Conde B, FNP 03/30/19 [id number]    Jeanmarie PlantMcShane, James A, MD 03/30/19 2333

## 2019-03-30 NOTE — ED Triage Notes (Signed)
Pt found tick on lateral R calf. Pt pulled tick out herself. Pt has bullseye induration on R calf. Pt states the tick was small and black. Pt states she took tick off last week, possibly Wed. Pt states induration began last night. Pt denies other sxs.

## 2019-03-30 NOTE — Discharge Instructions (Signed)
Return to the ER if the rash continues to spread or if you begin to have other symptoms of concern such as fever.

## 2019-04-02 LAB — ROCKY MTN SPOTTED FVR ABS PNL(IGG+IGM)
RMSF IgG: NEGATIVE
RMSF IgM: 0.88 index (ref 0.00–0.89)

## 2019-04-03 LAB — LYME DISEASE, WESTERN BLOT
IgG P18 Ab.: ABSENT
IgG P23 Ab.: ABSENT
IgG P28 Ab.: ABSENT
IgG P30 Ab.: ABSENT
IgG P39 Ab.: ABSENT
IgG P45 Ab.: ABSENT
IgG P58 Ab.: ABSENT
IgG P66 Ab.: ABSENT
IgG P93 Ab.: ABSENT
IgM P23 Ab.: ABSENT
IgM P39 Ab.: ABSENT
IgM P41 Ab.: ABSENT
Lyme IgG Wb: NEGATIVE
Lyme IgM Wb: NEGATIVE

## 2019-04-26 ENCOUNTER — Other Ambulatory Visit: Payer: Self-pay | Admitting: Obstetrics and Gynecology

## 2019-04-26 NOTE — Telephone Encounter (Signed)
The patient called and stated that she needs a refill of her depo injection. Her apt for injection is tomorrow 04/27/19. Please advise.

## 2019-04-27 ENCOUNTER — Other Ambulatory Visit: Payer: Self-pay

## 2019-04-27 ENCOUNTER — Encounter: Payer: Medicaid Other | Admitting: Certified Nurse Midwife

## 2019-04-27 ENCOUNTER — Ambulatory Visit (INDEPENDENT_AMBULATORY_CARE_PROVIDER_SITE_OTHER): Payer: Medicaid Other | Admitting: Obstetrics and Gynecology

## 2019-04-27 VITALS — BP 106/71 | HR 75 | Ht 63.0 in | Wt 224.6 lb

## 2019-04-27 DIAGNOSIS — Z3042 Encounter for surveillance of injectable contraceptive: Secondary | ICD-10-CM | POA: Diagnosis not present

## 2019-04-27 MED ORDER — MEDROXYPROGESTERONE ACETATE 150 MG/ML IM SUSP
150.0000 mg | Freq: Once | INTRAMUSCULAR | Status: AC
  Administered 2019-04-27: 150 mg via INTRAMUSCULAR

## 2019-04-27 NOTE — Progress Notes (Signed)
Date last pap: na Last Depo-Provera: 01/26/19 Side Effects if any: weight gain Serum HCG indicated? na Depo-Provera 150 mg IM given by: Rosine Beat, CMA Next appointment due July 31-Aug 14,2020 BP 106/71   Pulse 75   Ht 5\' 3"  (1.6 m)   Wt 224 lb 9.6 oz (101.9 kg)   BMI 39.79 kg/m

## 2019-05-08 ENCOUNTER — Telehealth: Payer: Self-pay

## 2019-05-08 NOTE — Telephone Encounter (Signed)
Coronavirus (COVID-19) Are you at risk?  Are you at risk for the Coronavirus (COVID-19)?  To be considered HIGH RISK for Coronavirus (COVID-19), you have to meet the following criteria:  . Traveled to [place], [place], [place], [place] or [place]; or in the [place] to [place], [place], [place], or [place]; and have fever, cough, and shortness of breath within the last 2 weeks of travel OR . Been in close contact with a person diagnosed with COVID-19 within the last 2 weeks and have fever, cough, and shortness of breath . IF YOU DO NOT MEET THESE CRITERIA, YOU ARE CONSIDERED LOW RISK FOR COVID-19.  What to do if you are HIGH RISK for COVID-19?  . If you are having a medical emergency, call 911. . Seek medical care right away. Before you go to a doctor's office, urgent care or emergency department, call ahead and tell them about your recent travel, contact with someone diagnosed with COVID-19, and your symptoms. You should receive instructions from your physician's office regarding next steps of care.  . When you arrive at healthcare provider, tell the healthcare staff immediately you have returned from visiting [place], [place], [place], [place] or [place]; or traveled in the [place] to [place], [place], [place], or [place]; in the last two weeks or you have been in close contact with a person diagnosed with COVID-19 in the last 2 weeks.   . Tell the health care staff about your symptoms: fever, cough and shortness of breath. . After you have been seen by a medical provider, you will be either: o Tested for (COVID-19) and discharged home on quarantine except to seek medical care if symptoms worsen, and asked to  - Stay home and avoid contact with others until you get your results (4-5 days)  - Avoid travel on public transportation if possible (such as bus, train, or airplane) or o Sent to the Emergency Department by EMS for evaluation, COVID-19 testing, and possible  admission depending on your condition and test results.  What to do if you are LOW RISK for COVID-19?  Reduce your risk of any infection by using the same precautions used for avoiding the common cold or flu:  . Wash your hands often with soap and warm water for at least 20 seconds.  If soap and water are not readily available, use an alcohol-based hand sanitizer with at least 60% alcohol.  . If coughing or sneezing, cover your mouth and nose by coughing or sneezing into the elbow areas of your shirt or coat, into a tissue or into your sleeve (not your hands). . Avoid shaking hands with others and consider head nods or verbal greetings only. . Avoid touching your eyes, nose, or mouth with unwashed hands.  . Avoid close contact with people who are sick. . Avoid places or events with large numbers of people in one location, like concerts or sporting events. . Carefully consider travel plans you have or are making. . If you are planning any travel outside or inside the [place], visit the CDC's Travelers' Health webpage for the latest health notices. . If you have some symptoms but not all symptoms, continue to monitor at home and seek medical attention if your symptoms worsen. . If you are having a medical emergency, call 911.   ADDITIONAL HEALTHCARE OPTIONS FOR PATIENTS  [facility] Telehealth / e-Visit: [email or web address]         [facility]: [phone]  [place]   [facility]: [phone]                   [facility]: [phone]   Pre-screen negative, DM.   

## 2019-05-09 ENCOUNTER — Other Ambulatory Visit: Payer: Self-pay

## 2019-05-09 ENCOUNTER — Ambulatory Visit (INDEPENDENT_AMBULATORY_CARE_PROVIDER_SITE_OTHER): Payer: Medicaid Other | Admitting: Obstetrics and Gynecology

## 2019-05-09 ENCOUNTER — Encounter: Payer: Self-pay | Admitting: Obstetrics and Gynecology

## 2019-05-09 VITALS — BP 112/70 | HR 67 | Ht 63.0 in | Wt 223.9 lb

## 2019-05-09 DIAGNOSIS — T50905A Adverse effect of unspecified drugs, medicaments and biological substances, initial encounter: Secondary | ICD-10-CM

## 2019-05-09 DIAGNOSIS — Z6839 Body mass index (BMI) 39.0-39.9, adult: Secondary | ICD-10-CM

## 2019-05-09 DIAGNOSIS — F3289 Other specified depressive episodes: Secondary | ICD-10-CM

## 2019-05-09 DIAGNOSIS — R635 Abnormal weight gain: Secondary | ICD-10-CM

## 2019-05-09 MED ORDER — BUPROPION HCL ER (XL) 150 MG PO TB24: 150.0000 mg | ORAL_TABLET | Freq: Every day | ORAL | 1 refills | Status: DC

## 2019-05-09 NOTE — Patient Instructions (Signed)
Bupropion extended-release tablets (Depression/Mood Disorders) What is this medicine? BUPROPION (byoo PROE pee on) is used to treat depression. This medicine may be used for other purposes; ask your health care provider or pharmacist if you have questions. COMMON BRAND NAME(S): Aplenzin, Budeprion XL, Forfivo XL, Wellbutrin XL What should I tell my health care provider before I take this medicine? They need to know if you have any of these conditions: -an eating disorder, such as anorexia or bulimia -bipolar disorder or psychosis -diabetes or high blood sugar, treated with medication -glaucoma -head injury or brain tumor -heart disease, previous heart attack, or irregular heart beat -high blood pressure -kidney or liver disease -seizures (convulsions) -suicidal thoughts or a previous suicide attempt -Tourette's syndrome -weight loss -an unusual or allergic reaction to bupropion, other medicines, foods, dyes, or preservatives -breast-feeding -pregnant or trying to become pregnant How should I use this medicine? Take this medicine by mouth with a glass of water. Follow the directions on the prescription label. You can take it with or without food. If it upsets your stomach, take it with food. Do not crush, chew, or cut these tablets. This medicine is taken once daily at the same time each day. Do not take your medicine more often than directed. Do not stop taking this medicine suddenly except upon the advice of your doctor. Stopping this medicine too quickly may cause serious side effects or your condition may worsen. A special MedGuide will be given to you by the pharmacist with each prescription and refill. Be sure to read this information carefully each time. Talk to your pediatrician regarding the use of this medicine in children. Special care may be needed. Overdosage: If you think you have taken too much of this medicine contact a poison control center or emergency room at  once. NOTE: This medicine is only for you. Do not share this medicine with others. What if I miss a dose? If you miss a dose, skip the missed dose and take your next tablet at the regular time. Do not take double or extra doses. What may interact with this medicine? Do not take this medicine with any of the following medications: -linezolid -MAOIs like Azilect, Carbex, Eldepryl, Marplan, Nardil, and Parnate -methylene blue (injected into a vein) -other medicines that contain bupropion like Zyban This medicine may also interact with the following medications: -alcohol -certain medicines for anxiety or sleep -certain medicines for blood pressure like metoprolol, propranolol -certain medicines for depression or psychotic disturbances -certain medicines for HIV or AIDS like efavirenz, lopinavir, nelfinavir, ritonavir -certain medicines for irregular heart beat like propafenone, flecainide -certain medicines for Parkinson's disease like amantadine, levodopa -certain medicines for seizures like carbamazepine, phenytoin, phenobarbital -cimetidine -clopidogrel -cyclophosphamide -digoxin -furazolidone -isoniazid -nicotine -orphenadrine -procarbazine -steroid medicines like prednisone or cortisone -stimulant medicines for attention disorders, weight loss, or to stay awake -tamoxifen -theophylline -thiotepa -ticlopidine -tramadol -warfarin This list may not describe all possible interactions. Give your health care provider a list of all the medicines, herbs, non-prescription drugs, or dietary supplements you use. Also tell them if you smoke, drink alcohol, or use illegal drugs. Some items may interact with your medicine. What should I watch for while using this medicine? Tell your doctor if your symptoms do not get better or if they get worse. Visit your doctor or health care professional for regular checks on your progress. Because it may take several weeks to see the full effects of this  medicine, it is important to continue your treatment as  prescribed by your doctor. Patients and their families should watch out for new or worsening thoughts of suicide or depression. Also watch out for sudden changes in feelings such as feeling anxious, agitated, panicky, irritable, hostile, aggressive, impulsive, severely restless, overly excited and hyperactive, or not being able to sleep. If this happens, especially at the beginning of treatment or after a change in dose, call your health care professional. Avoid alcoholic drinks while taking this medicine. Drinking large amounts of alcoholic beverages, using sleeping or anxiety medicines, or quickly stopping the use of these agents while taking this medicine may increase your risk for a seizure. Do not drive or use heavy machinery until you know how this medicine affects you. This medicine can impair your ability to perform these tasks. Do not take this medicine close to bedtime. It may prevent you from sleeping. Your mouth may get dry. Chewing sugarless gum or sucking hard candy, and drinking plenty of water may help. Contact your doctor if the problem does not go away or is severe. The tablet shell for some brands of this medicine does not dissolve. This is normal. The tablet shell may appear whole in the stool. This is not a cause for concern. What side effects may I notice from receiving this medicine? Side effects that you should report to your doctor or health care professional as soon as possible: -allergic reactions like skin rash, itching or hives, swelling of the face, lips, or tongue -breathing problems -changes in vision -confusion -elevated mood, decreased need for sleep, racing thoughts, impulsive behavior -fast or irregular heartbeat -hallucinations, loss of contact with reality -increased blood pressure -redness, blistering, peeling or loosening of the skin, including inside the mouth -seizures -suicidal thoughts or other mood  changes -unusually weak or tired -vomiting Side effects that usually do not require medical attention (report to your doctor or health care professional if they continue or are bothersome): -constipation -headache -loss of appetite -nausea -tremors -weight loss This list may not describe all possible side effects. Call your doctor for medical advice about side effects. You may report side effects to FDA at 1-800-FDA-1088. Where should I keep my medicine? Keep out of the reach of children. Store at room temperature between 15 and 30 degrees C (59 and 86 degrees F). Throw away any unused medicine after the expiration date. NOTE: This sheet is a summary. It may not cover all possible information. If you have questions about this medicine, talk to your doctor, pharmacist, or health care provider.  2019 Elsevier/Gold Standard (2016-05-21 13:55:13) Levonorgestrel intrauterine device (IUD) What is this medicine? LEVONORGESTREL IUD (LEE voe nor jes trel) is a contraceptive (birth control) device. The device is placed inside the uterus by a healthcare professional. It is used to prevent pregnancy. This device can also be used to treat heavy bleeding that occurs during your period. This medicine may be used for other purposes; ask your health care provider or pharmacist if you have questions. COMMON BRAND NAME(S): Cameron Ali What should I tell my health care provider before I take this medicine? They need to know if you have any of these conditions: -abnormal Pap smear -cancer of the breast, uterus, or cervix -diabetes -endometritis -genital or pelvic infection now or in the past -have more than one sexual partner or your partner has more than one partner -heart disease -history of an ectopic or tubal pregnancy -immune system problems -IUD in place -liver disease or tumor -problems with blood clots or take blood-thinners -  seizures -use intravenous drugs -uterus of  unusual shape -vaginal bleeding that has not been explained -an unusual or allergic reaction to levonorgestrel, other hormones, silicone, or polyethylene, medicines, foods, dyes, or preservatives -pregnant or trying to get pregnant -breast-feeding How should I use this medicine? This device is placed inside the uterus by a health care professional. Talk to your pediatrician regarding the use of this medicine in children. Special care may be needed. Overdosage: If you think you have taken too much of this medicine contact a poison control center or emergency room at once. NOTE: This medicine is only for you. Do not share this medicine with others. What if I miss a dose? This does not apply. Depending on the brand of device you have inserted, the device will need to be replaced every 3 to 5 years if you wish to continue using this type of birth control. What may interact with this medicine? Do not take this medicine with any of the following medications: -amprenavir -bosentan -fosamprenavir This medicine may also interact with the following medications: -aprepitant -armodafinil -barbiturate medicines for inducing sleep or treating seizures -bexarotene -boceprevir -griseofulvin -medicines to treat seizures like carbamazepine, ethotoin, felbamate, oxcarbazepine, phenytoin, topiramate -modafinil -pioglitazone -rifabutin -rifampin -rifapentine -some medicines to treat HIV infection like atazanavir, efavirenz, indinavir, lopinavir, nelfinavir, tipranavir, ritonavir -St. John's wort -warfarin This list may not describe all possible interactions. Give your health care provider a list of all the medicines, herbs, non-prescription drugs, or dietary supplements you use. Also tell them if you smoke, drink alcohol, or use illegal drugs. Some items may interact with your medicine. What should I watch for while using this medicine? Visit your doctor or health care professional for regular check  ups. See your doctor if you or your partner has sexual contact with others, becomes HIV positive, or gets a sexual transmitted disease. This product does not protect you against HIV infection (AIDS) or other sexually transmitted diseases. You can check the placement of the IUD yourself by reaching up to the top of your vagina with clean fingers to feel the threads. Do not pull on the threads. It is a good habit to check placement after each menstrual period. Call your doctor right away if you feel more of the IUD than just the threads or if you cannot feel the threads at all. The IUD may come out by itself. You may become pregnant if the device comes out. If you notice that the IUD has come out use a backup birth control method like condoms and call your health care provider. Using tampons will not change the position of the IUD and are okay to use during your period. This IUD can be safely scanned with magnetic resonance imaging (MRI) only under specific conditions. Before you have an MRI, tell your healthcare provider that you have an IUD in place, and which type of IUD you have in place. What side effects may I notice from receiving this medicine? Side effects that you should report to your doctor or health care professional as soon as possible: -allergic reactions like skin rash, itching or hives, swelling of the face, lips, or tongue -fever, flu-like symptoms -genital sores -high blood pressure -no menstrual period for 6 weeks during use -pain, swelling, warmth in the leg -pelvic pain or tenderness -severe or sudden headache -signs of pregnancy -stomach cramping -sudden shortness of breath -trouble with balance, talking, or walking -unusual vaginal bleeding, discharge -yellowing of the eyes or skin Side effects that usually do  not require medical attention (report to your doctor or health care professional if they continue or are bothersome): -acne -breast pain -change in sex drive or  performance -changes in weight -cramping, dizziness, or faintness while the device is being inserted -headache -irregular menstrual bleeding within first 3 to 6 months of use -nausea This list may not describe all possible side effects. Call your doctor for medical advice about side effects. You may report side effects to FDA at 1-800-FDA-1088. Where should I keep my medicine? This does not apply. NOTE: This sheet is a summary. It may not cover all possible information. If you have questions about this medicine, talk to your doctor, pharmacist, or health care provider.  2019 Elsevier/Gold Standard (2016-09-10 14:14:56)

## 2019-05-09 NOTE — Progress Notes (Signed)
Subjective:     Patient ID: Lauren Mercer, female   DOB: 1991-06-28, 28 y.o.   MRN: [id number]  HPI Here to discuss weight gain due to depo and other birth control options. Has gained 45 lbs in the last 7 months. States she had significant weight gain with depo use in the past. It is why she stopped it and switched to OCPs, and got pregnant on OCPs. Used nexplanon in the past but had heavy and irregular bleeding with that.   Also still struggling with depression, she took 1 month of the prozac and couldn't tell a difference. Is open to trying something else.  Depression screen [facility] 2/9 05/09/2019 02/23/2018 10/20/2017 08/18/2017  Decreased Interest 3 0 3 0  Down, Depressed, Hopeless 1 1 3 1   PHQ - 2 Score 4 1 6 1   Altered sleeping 3 1 3  0  Tired, decreased energy 3 1 3 1   Change in appetite 3 2 3  0  Feeling bad or failure about yourself  1 0 1 -  Trouble concentrating 1 0 1 0  Moving slowly or fidgety/restless 0 0 0 0  Suicidal thoughts 0 0 0 0  PHQ-9 Score 15 5 17 2   Difficult doing work/chores Extremely dIfficult Not difficult at all Extremely dIfficult Not difficult at all   States she has started a new job at [employer] and it helps to get out of the house.   Review of Systems  Constitutional: Positive for appetite change and unexpected weight change.  Psychiatric/Behavioral: Positive for dysphoric mood.  All other systems reviewed and are negative.      Objective:   Physical Exam A&Ox4 Well groomed female, tearful when discussing concerns Blood pressure 112/70, pulse 67, height 5\' 3"  (1.6 m), weight 223 lb 14.4 oz (101.6 kg), not currently breastfeeding. Body mass index is 39.66 kg/m.  Thyroid normal on exam Psychiatric Specialty Exam: Physical Exam  Review of Systems  Constitutional: Positive for appetite change and unexpected weight change.  Psychiatric/Behavioral: Positive for dysphoric mood.  All other systems reviewed and are negative.   Blood pressure 112/70,  pulse 67, height 5\' 3"  (1.6 m), weight 223 lb 14.4 oz (101.6 kg), not currently breastfeeding.Body mass index is 39.66 kg/m.  General Appearance: Fairly Groomed and Neat  Eye Contact:  Good  Speech:  Clear and Coherent  Volume:  Normal  Mood:  Depressed and Dysphoric  Affect:  Appropriate and Tearful  Thought Process:  Goal Directed  Orientation:  Full (Time, Place, and Person)  Thought Content:  WDL  Suicidal Thoughts:  No  Homicidal Thoughts:  No  Memory:  Immediate;   Good Recent;   Good Remote;   Good  Judgement:  Good  Insight:  Fair  Psychomotor Activity:  Normal  Concentration:  Concentration: Good and Attention Span: Good  Recall:  Good  Fund of Knowledge:  Good  Language:  Good  Akathisia:  Negative  Handed:  Right  AIMS (if indicated):     Assets:  Desire for Improvement Financial Resources/Insurance Housing Intimacy Resilience Social Support Transportation  ADL's:  Intact  Cognition:  WNL  Sleep:         Assessment:     Weight gain secondary to depo BMI 39 Depression     Plan:     Discussed all BC options and patient desires Mirena IUD. Will RTC in 5 weeks for insertion. Information given for her to review. Discussed trial of wellbutrin for depression and will start at 150mg  now,  will re-assess at 5 week visit, sooner if needed. Will consider adipex and B12 weight loss program here if needed in the future.   Yardley Beltran,CNM

## 2019-06-12 ENCOUNTER — Telehealth: Payer: Self-pay

## 2019-06-12 NOTE — Telephone Encounter (Signed)
Coronavirus (COVID-19) Are you at risk?  Are you at risk for the Coronavirus (COVID-19)?  To be considered HIGH RISK for Coronavirus (COVID-19), you have to meet the following criteria:  . Traveled to [place], [place], [place], [place] or [place]; or in the [place] to [place], [place], [place], or [place]; and have fever, cough, and shortness of breath within the last 2 weeks of travel OR . Been in close contact with a person diagnosed with COVID-19 within the last 2 weeks and have fever, cough, and shortness of breath . IF YOU DO NOT MEET THESE CRITERIA, YOU ARE CONSIDERED LOW RISK FOR COVID-19.  What to do if you are HIGH RISK for COVID-19?  . If you are having a medical emergency, call 911. . Seek medical care right away. Before you go to a doctor's office, urgent care or emergency department, call ahead and tell them about your recent travel, contact with someone diagnosed with COVID-19, and your symptoms. You should receive instructions from your physician's office regarding next steps of care.  . When you arrive at healthcare provider, tell the healthcare staff immediately you have returned from visiting [place], [place], [place], [place] or [place]; or traveled in the [place] to [place], [place], [place], or [place]; in the last two weeks or you have been in close contact with a person diagnosed with COVID-19 in the last 2 weeks.   . Tell the health care staff about your symptoms: fever, cough and shortness of breath. . After you have been seen by a medical provider, you will be either: o Tested for (COVID-19) and discharged home on quarantine except to seek medical care if symptoms worsen, and asked to  - Stay home and avoid contact with others until you get your results (4-5 days)  - Avoid travel on public transportation if possible (such as bus, train, or airplane) or o Sent to the Emergency Department by EMS for evaluation, COVID-19 testing, and possible  admission depending on your condition and test results.  What to do if you are LOW RISK for COVID-19?  Reduce your risk of any infection by using the same precautions used for avoiding the common cold or flu:  . Wash your hands often with soap and warm water for at least 20 seconds.  If soap and water are not readily available, use an alcohol-based hand sanitizer with at least 60% alcohol.  . If coughing or sneezing, cover your mouth and nose by coughing or sneezing into the elbow areas of your shirt or coat, into a tissue or into your sleeve (not your hands). . Avoid shaking hands with others and consider head nods or verbal greetings only. . Avoid touching your eyes, nose, or mouth with unwashed hands.  . Avoid close contact with people who are sick. . Avoid places or events with large numbers of people in one location, like concerts or sporting events. . Carefully consider travel plans you have or are making. . If you are planning any travel outside or inside the [place], visit the CDC's Travelers' Health webpage for the latest health notices. . If you have some symptoms but not all symptoms, continue to monitor at home and seek medical attention if your symptoms worsen. . If you are having a medical emergency, call 911.   ADDITIONAL HEALTHCARE OPTIONS FOR PATIENTS   Telehealth / e-Visit: [email or web address]         [facility]: [phone]     Urgent Care: [phone]                   [facility]: [phone]   Pre-screen negative, DM.   

## 2019-06-13 ENCOUNTER — Encounter: Payer: Self-pay | Admitting: Obstetrics and Gynecology

## 2019-06-13 ENCOUNTER — Ambulatory Visit (INDEPENDENT_AMBULATORY_CARE_PROVIDER_SITE_OTHER): Payer: Medicaid Other | Admitting: Obstetrics and Gynecology

## 2019-06-13 ENCOUNTER — Other Ambulatory Visit: Payer: Self-pay

## 2019-06-13 VITALS — BP 111/75 | HR 68 | Ht 63.0 in | Wt 218.0 lb

## 2019-06-13 DIAGNOSIS — F3289 Other specified depressive episodes: Secondary | ICD-10-CM

## 2019-06-13 DIAGNOSIS — Z30430 Encounter for insertion of intrauterine contraceptive device: Secondary | ICD-10-CM

## 2019-06-13 NOTE — Progress Notes (Signed)
Lauren Mercer is a 28 y.o. year old [id number] Caucasian female who presents for placement of a Mirena IUD.she is switching from Depo due to excessive weight gain.  And depression medication follow up. See PHQ score below, and also to note she has lost 5 lbs in 5 weeks.   Depression screen [facility] 2/9 06/13/2019 05/09/2019 02/23/2018 10/20/2017 08/18/2017  Decreased Interest 0 3 0 3 0  Down, Depressed, Hopeless 1 1 1 3 1   PHQ - 2 Score 1 4 1 6 1   Altered sleeping 0 3 1 3  0  Tired, decreased energy 0 3 1 3 1   Change in appetite 0 3 2 3  0  Feeling bad or failure about yourself  0 1 0 1 -  Trouble concentrating 0 1 0 1 0  Moving slowly or fidgety/restless 0 0 0 0 0  Suicidal thoughts 0 0 0 0 0  PHQ-9 Score 1 15 5 17 2   Difficult doing work/chores Not difficult at all Extremely dIfficult Not difficult at all Extremely dIfficult Not difficult at all    No LMP recorded. Patient has had an injection. BP 111/75   Pulse 68   Ht 5\' 3"  (1.6 m)   Wt 218 lb (98.9 kg)   BMI 38.62 kg/m  Last sexual intercourse was a few days ago  The risks and benefits of the method and placement have been thouroughly reviewed with the patient and all questions were answered.  Specifically the patient is aware of failure rate of 12/998, expulsion of the IUD and of possible perforation.  The patient is aware of irregular bleeding due to the method and understands the incidence of irregular bleeding diminishes with time.  Signed copy of informed consent in chart.   Time out was performed.  A graves speculum was placed in the vagina.  The cervix was visualized, prepped using Betadine, and grasped with a single tooth tenaculum. The uterus was found to be neutral and it sounded to 8 cm.  Mirena IUD placed per manufacturer's recommendations.   The strings were trimmed to 3 cm.   The patient was given post procedure instructions, including signs and symptoms of infection and to check for the strings after each menses or each  month, and refraining from intercourse or anything in the vagina for 3 days.  She was given a Mirena care card with date Mirena placed, and date Mirena to be removed.   Will continue with wellbutrin and discuss more at recheck in 6 weeks  Rockney Ghee, CNM

## 2019-06-13 NOTE — Patient Instructions (Signed)

## 2019-07-12 ENCOUNTER — Other Ambulatory Visit: Payer: Self-pay | Admitting: Obstetrics and Gynecology

## 2019-07-25 ENCOUNTER — Other Ambulatory Visit: Payer: Self-pay

## 2019-07-25 ENCOUNTER — Encounter: Payer: Self-pay | Admitting: Obstetrics and Gynecology

## 2019-07-25 ENCOUNTER — Ambulatory Visit (INDEPENDENT_AMBULATORY_CARE_PROVIDER_SITE_OTHER): Payer: Medicaid Other | Admitting: Obstetrics and Gynecology

## 2019-07-25 VITALS — BP 128/78 | HR 98 | Ht 63.0 in | Wt 210.3 lb

## 2019-07-25 DIAGNOSIS — Z30431 Encounter for routine checking of intrauterine contraceptive device: Secondary | ICD-10-CM

## 2019-07-25 NOTE — Progress Notes (Signed)
  Subjective:     Patient ID: Lauren Mercer, female   DOB: 1991/06/15, 28 y.o.   MRN: [id number]  HPI Here for IUD check after insertion on 06/13/2019. Denies any concerns and is happy with IUD.   Review of Systems  All other systems reviewed and are negative.      Objective:   Physical Exam A&Ox4 Well groomed female in no distress Blood pressure 128/78, pulse 98, height 5\' 3"  (1.6 m), weight 210 lb 4.8 oz (95.4 kg), not currently breastfeeding. Pelvic exam: normal external genitalia, vulva, vagina, cervix, uterus and adnexa, IUD string noted.    Assessment:     IUD check    Plan:     Will continue with IUD use.  RTC as needed,  Melody Shambley,CNM

## 2019-08-19 ENCOUNTER — Other Ambulatory Visit: Payer: Self-pay | Admitting: Obstetrics and Gynecology

## 2019-09-08 ENCOUNTER — Other Ambulatory Visit: Payer: Self-pay | Admitting: General Practice

## 2019-09-08 DIAGNOSIS — Z20822 Contact with and (suspected) exposure to covid-19: Secondary | ICD-10-CM

## 2019-09-09 LAB — NOVEL CORONAVIRUS, NAA: SARS-CoV-2, NAA: NOT DETECTED

## 2019-11-23 ENCOUNTER — Other Ambulatory Visit: Payer: Self-pay

## 2019-11-23 ENCOUNTER — Encounter: Payer: Self-pay | Admitting: Family Medicine

## 2019-11-23 ENCOUNTER — Ambulatory Visit (INDEPENDENT_AMBULATORY_CARE_PROVIDER_SITE_OTHER): Payer: Medicaid Other | Admitting: Family Medicine

## 2019-11-23 ENCOUNTER — Ambulatory Visit: Payer: Medicaid Other | Admitting: Family Medicine

## 2019-11-23 VITALS — BP 120/80 | HR 86 | Temp 97.1°F | Resp 16 | Ht 63.0 in | Wt 209.8 lb

## 2019-11-23 DIAGNOSIS — Z7189 Other specified counseling: Secondary | ICD-10-CM | POA: Diagnosis not present

## 2019-11-23 DIAGNOSIS — Z975 Presence of (intrauterine) contraceptive device: Secondary | ICD-10-CM | POA: Diagnosis not present

## 2019-11-23 DIAGNOSIS — E6609 Other obesity due to excess calories: Secondary | ICD-10-CM | POA: Diagnosis not present

## 2019-11-23 DIAGNOSIS — Z113 Encounter for screening for infections with a predominantly sexual mode of transmission: Secondary | ICD-10-CM

## 2019-11-23 DIAGNOSIS — R42 Dizziness and giddiness: Secondary | ICD-10-CM

## 2019-11-23 DIAGNOSIS — F321 Major depressive disorder, single episode, moderate: Secondary | ICD-10-CM | POA: Insufficient documentation

## 2019-11-23 DIAGNOSIS — Z114 Encounter for screening for human immunodeficiency virus [HIV]: Secondary | ICD-10-CM | POA: Diagnosis not present

## 2019-11-23 DIAGNOSIS — Z23 Encounter for immunization: Secondary | ICD-10-CM

## 2019-11-23 DIAGNOSIS — Z1159 Encounter for screening for other viral diseases: Secondary | ICD-10-CM | POA: Diagnosis not present

## 2019-11-23 DIAGNOSIS — Z6837 Body mass index (BMI) 37.0-37.9, adult: Secondary | ICD-10-CM

## 2019-11-23 MED ORDER — BUPROPION HCL ER (XL) 150 MG PO TB24: 150.0000 mg | ORAL_TABLET | Freq: Every day | ORAL | 1 refills | Status: DC

## 2019-11-23 MED ORDER — ESCITALOPRAM OXALATE 5 MG PO TABS: 5.0000 mg | ORAL_TABLET | Freq: Every day | ORAL | 1 refills | Status: DC

## 2019-11-23 NOTE — Progress Notes (Signed)
Name: Lauren Mercer   MRN: [id number]    DOB: 26-Dec-1990   Date:11/23/2019       Progress Note  Subjective  Chief Complaint  Chief Complaint  Patient presents with   Establish Care   Depression    medication refill   Referral    for Covid Test    HPI  Pt presents to establish care and for the following:  COVID-19 exposure: She works at [employer] and was recently informed that one of her co-workers tested positive over 2 weeks prior.  She has been asymptomatic and afebrile.  Discussed typical course of symptoms developing in the first 10-14 days.    Depression: She was initially on Prozac for postpartum depression (daughter is now 60 years old).  She was then switched about 4-5 months ago to Wellbutrin.  She feels like the Wellbutrin helps some, but still struggles 3-4 days a week with feelings of sadness and low motivation.  She also notes anxiety - no panic attacks, but higher anxiety days where she sometimes feels lightheaded when she gets stressed or skips meals.  She also struggles with falling asleep - she is bed sharing with her daughter - discussed the inherent dangers of bed sharing with her and encouraged her to stop immediately.  We will avoid any sedating medications.  Discussed medication options including adding lexapro vs switching off of Wellbutrin and onto Lexapro.      Office Visit from 11/23/2019 in [facility]  PHQ-9 Total Score  11     IUD In place: She had placed 06/13/2019 - she has been doing really well with this; no breakthrough bleeding.   Obesity: She is taking Wellbutrin, which she states that she has lost 10lbs.  Her diet is very sporadic because her shifts at [employer] are varied.  She tries to exercise when she has time, but works 50-60hours a week.  There are no problems to display for this patient.   Past Surgical History:  Procedure Laterality Date   NO PAST SURGERIES      Family History  Problem Relation Age of  Onset   Diabetes Maternal Uncle     Social History   Socioeconomic History   Marital status: Single    Spouse name: Not on file   Number of children: 1   Years of education: Not on file   Highest education level: Not on file  Occupational History   Occupation: [employer]  Tobacco Use   Smoking status: Never Smoker   Smokeless tobacco: Never Used  Substance and Sexual Activity   Alcohol use: No   Drug use: No   Sexual activity: Yes    Partners: Male    Birth control/protection: I.U.D.    Comment: mirena  Other Topics Concern   Not on file  Social History Narrative   Not on file   Social Determinants of Health   Financial Resource Strain:    Difficulty of Paying Living Expenses: Not on file  Food Insecurity:    Worried About [place] in the Last Year: Not on file   Ran Out of Food in the Last Year: Not on file  Transportation Needs:    Lack of Transportation (Medical): Not on file   Lack of Transportation (Non-Medical): Not on file  Physical Activity:    Days of Exercise per Week: Not on file   Minutes of Exercise per Session: Not on file  Stress:    Feeling of Stress :  Not on file  Social Connections:    Frequency of Communication with Friends and Family: Not on file   Frequency of Social Gatherings with Friends and Family: Not on file   Attends Religious Services: Not on file   Active Member of Clubs or Organizations: Not on file   Attends Banker Meetings: Not on file   Marital Status: Not on file  Intimate Partner Violence:    Fear of Current or Ex-Partner: Not on file   Emotionally Abused: Not on file   Physically Abused: Not on file   Sexually Abused: Not on file     Current Outpatient Medications:    albuterol (PROVENTIL HFA;VENTOLIN HFA) 108 (90 Base) MCG/ACT inhaler, Inhale 2 puffs into the lungs every 6 (six) hours as needed for wheezing or shortness of breath., Disp: 1 Inhaler, Rfl: 2    buPROPion (WELLBUTRIN XL) 150 MG 24 hr tablet, Take 1 tablet by mouth once daily, Disp: 30 tablet, Rfl: 3   levonorgestrel (MIRENA) 20 MCG/24HR IUD, 1 each by Intrauterine route once., Disp: , Rfl:    medroxyPROGESTERone Acetate 150 MG/ML SUSY, INJECT 1 ML (150 MG TOTAL) INTO THE MUSCLE EVERY 3 MONTHS (Patient not taking: Reported on 06/13/2019), Disp: 1 mL, Rfl: 0  No Known Allergies  I personally reviewed active problem list, medication list, allergies, health maintenance, notes from last encounter, lab results with the patient/caregiver today.   ROS  Ten systems reviewed and is negative except as mentioned in HPI  Objective  Vitals:   11/23/19 0831  BP: 120/80  Pulse: 86  Resp: 16  Temp: (!) 97.1 F (36.2 C)  TempSrc: Temporal  SpO2: 99%  Weight: 209 lb 12.8 oz (95.2 kg)  Height: 5\' 3"  (1.6 m)   Body mass index is 37.16 kg/m.  Physical Exam  Constitutional: Patient appears well-developed and well-nourished. No distress.  HENT: Head: Normocephalic and atraumatic. Eyes: Conjunctivae and EOM are normal. No scleral icterus.  Neck: Normal range of motion. Neck supple. No JVD present. No thyromegaly present.  Cardiovascular: Normal rate, regular rhythm and normal heart sounds.  No murmur heard. No BLE edema. Pulmonary/Chest: Effort normal and breath sounds normal. No respiratory distress. Musculoskeletal: Normal range of motion, no joint effusions. No gross deformities Neurological: Pt is alert and oriented to person, place, and time. No cranial nerve deficit. Coordination, balance, strength, speech and gait are normal.  Skin: Skin is warm and dry. No rash noted. No erythema.  Psychiatric: Patient has a normal mood and affect. behavior is normal. Judgment and thought content normal.  No results found for this or any previous visit (from the past 72 hour(s)).   PHQ2/9: Depression screen [facility] 2/9 11/23/2019 06/13/2019 05/09/2019 02/23/2018 10/20/2017  Decreased Interest 2 0 3 0 3   Down, Depressed, Hopeless 2 1 1 1 3   PHQ - 2 Score 4 1 4 1 6   Altered sleeping 3 0 3 1 3   Tired, decreased energy 1 0 3 1 3   Change in appetite 2 0 3 2 3   Feeling bad or failure about yourself  0 0 1 0 1  Trouble concentrating 1 0 1 0 1  Moving slowly or fidgety/restless 0 0 0 0 0  Suicidal thoughts 0 0 0 0 0  PHQ-9 Score 11 1 15 5 17   Difficult doing work/chores Somewhat difficult Not difficult at all Extremely dIfficult Not difficult at all Extremely dIfficult   PHQ-2/9 Result is positive.    Fall Risk: Fall Risk  11/23/2019  Falls in the past year? 0  Number falls in past yr: 0  Injury with Fall? 0  Follow up Falls evaluation completed   Assessment & Plan  1. Current moderate episode of major depressive disorder without prior episode (HCC) - escitalopram (LEXAPRO) 5 MG tablet; Take 1 tablet (5 mg total) by mouth daily.  Dispense: 30 tablet; Refill: 1 - buPROPion (WELLBUTRIN XL) 150 MG 24 hr tablet; Take 1 tablet (150 mg total) by mouth daily.  Dispense: 90 tablet; Refill: 1  2. IUD (intrauterine device) in place - Placed July 2020.  Doing well  3. Class 2 obesity due to excess calories without serious comorbidity with body mass index (BMI) of 37.0 to 37.9 in adult - Wellbutrin - Discussed importance of 150 minutes of physical activity weekly, eat two servings of fish weekly, eat one serving of tree nuts ( cashews, pistachios, pecans, almonds.Marland Kitchen.) every other day, eat 6 servings of fruit/vegetables daily and drink plenty of water and avoid sweet beverages.  4. Episodic lightheadedness - COMPLETE METABOLIC PANEL WITH GFR - CBC with Differential/Platelet - TSH  5. Encounter for screening for HIV - HIV Antibody (routine testing w rflx)  6. Need for hepatitis C screening test - Hepatitis C antibody  7. Routine screening for STI (sexually transmitted infection) - HIV Antibody (routine testing w rflx) - RPR - Will do Pap with GC probe at CPE upcoming.

## 2019-11-26 LAB — CBC WITH DIFFERENTIAL/PLATELET
Absolute Monocytes: 502 cells/uL (ref 200–950)
Basophils Absolute: 53 cells/uL (ref 0–200)
Basophils Relative: 0.6 %
Eosinophils Absolute: 220 cells/uL (ref 15–500)
Eosinophils Relative: 2.5 %
HCT: 38.2 % (ref 35.0–45.0)
Hemoglobin: 12.7 g/dL (ref 11.7–15.5)
Lymphs Abs: 2244 cells/uL (ref 850–3900)
MCH: 29.3 pg (ref 27.0–33.0)
MCHC: 33.2 g/dL (ref 32.0–36.0)
MCV: 88.2 fL (ref 80.0–100.0)
MPV: 11 fL (ref 7.5–12.5)
Monocytes Relative: 5.7 %
Neutro Abs: 5782 cells/uL (ref 1500–7800)
Neutrophils Relative %: 65.7 %
Platelets: 285 10*3/uL (ref 140–400)
RBC: 4.33 10*6/uL (ref 3.80–5.10)
RDW: 11.7 % (ref 11.0–15.0)
Total Lymphocyte: 25.5 %
WBC: 8.8 10*3/uL (ref 3.8–10.8)

## 2019-11-26 LAB — COMPLETE METABOLIC PANEL WITH GFR
AG Ratio: 1.9 (calc) (ref 1.0–2.5)
ALT: 16 U/L (ref 6–29)
AST: 15 U/L (ref 10–30)
Albumin: 4.3 g/dL (ref 3.6–5.1)
Alkaline phosphatase (APISO): 57 U/L (ref 31–125)
BUN: 12 mg/dL (ref 7–25)
CO2: 21 mmol/L (ref 20–32)
Calcium: 9.1 mg/dL (ref 8.6–10.2)
Chloride: 109 mmol/L (ref 98–110)
Creat: 0.57 mg/dL (ref 0.50–1.10)
GFR, Est African American: 149 mL/min/{1.73_m2} (ref >=60)
GFR, Est Non African American: 129 mL/min/{1.73_m2} (ref >=60)
Globulin: 2.3 g/dL (calc) (ref 1.9–3.7)
Glucose, Bld: 80 mg/dL (ref 65–99)
Potassium: 4.1 mmol/L (ref 3.5–5.3)
Sodium: 141 mmol/L (ref 135–146)
Total Bilirubin: 0.4 mg/dL (ref 0.2–1.2)
Total Protein: 6.6 g/dL (ref 6.1–8.1)

## 2019-11-26 LAB — TSH: TSH: 1.27 mIU/L

## 2019-11-26 LAB — HEPATITIS C ANTIBODY
Hepatitis C Ab: NONREACTIVE
SIGNAL TO CUT-OFF: 0.01 (ref <1.00)

## 2019-11-26 LAB — HIV ANTIBODY (ROUTINE TESTING W REFLEX): HIV 1&2 Ab, 4th Generation: NONREACTIVE

## 2019-11-26 LAB — LIPID PANEL
Cholesterol: 149 mg/dL (ref <200)
HDL: 46 mg/dL — ABNORMAL LOW (ref >=50)
LDL Cholesterol (Calc): 90 mg/dL (calc)
Non-HDL Cholesterol (Calc): 103 mg/dL (calc) (ref <130)
Total CHOL/HDL Ratio: 3.2 (calc) (ref <5.0)
Triglycerides: 50 mg/dL (ref <150)

## 2019-11-26 LAB — RPR: RPR Ser Ql: NONREACTIVE

## 2019-12-24 ENCOUNTER — Encounter: Payer: Self-pay | Admitting: Family Medicine

## 2019-12-24 ENCOUNTER — Other Ambulatory Visit: Payer: Self-pay

## 2019-12-24 ENCOUNTER — Ambulatory Visit (INDEPENDENT_AMBULATORY_CARE_PROVIDER_SITE_OTHER): Payer: Medicaid Other | Admitting: Family Medicine

## 2019-12-24 VITALS — BP 118/72 | HR 73 | Temp 97.9°F | Resp 16 | Ht 63.0 in | Wt 211.5 lb

## 2019-12-24 DIAGNOSIS — Z6837 Body mass index (BMI) 37.0-37.9, adult: Secondary | ICD-10-CM | POA: Diagnosis not present

## 2019-12-24 DIAGNOSIS — F321 Major depressive disorder, single episode, moderate: Secondary | ICD-10-CM | POA: Diagnosis not present

## 2019-12-24 DIAGNOSIS — E6609 Other obesity due to excess calories: Secondary | ICD-10-CM

## 2019-12-24 MED ORDER — ESCITALOPRAM OXALATE 5 MG PO TABS: 5.0000 mg | ORAL_TABLET | Freq: Every day | ORAL | 0 refills | Status: DC

## 2019-12-24 NOTE — Patient Instructions (Signed)
HDL removes extra cholesterol and plaque buildup in your arteries and then sends it to your liver to remove it from your body; this helps reduce your risk of heart disease, heart attack, and stroke.  Foods that increase HDL include beans and legumes, whole grains, high-fiber fruits:prunes, apples, and pears; fatty fish- salmon, tuna, sardines; nuts, olive oil.

## 2019-12-24 NOTE — Progress Notes (Signed)
Name: Lauren Mercer   MRN: [id number]    DOB: September 26, 1991   Date:12/24/2019       Progress Note  Subjective  Chief Complaint  Chief Complaint  Patient presents with  . Follow-up    depression    HPI  Depression: She was initially on Prozac for postpartum depression (daughter is now 27 years old).  She was then switched to Wellbutrin which helped some, but was still struggling with depressive symptoms. We added Lexapro about 5 weeks ago and she has been doing much better since starting this.  Sleeping better - about 7 hours a night, energy levels are adequate, motivation is significantly improved.  PHQ-9 score is improved, but not significantly yet - discussed giving this some additional time vs increasing to 10mg .  She'd like to wait another 1-2 months and see how she does before increasing her dose.  Denies SI/HI  Obesity: She got a treadmill and is trying to walk more often; she is also working on eating healthier - works a lot of hours at [employer], so this is difficult.  Discussed packing healthy snacks.  Congratulated her on starting to exercise more.   Patient Active Problem List   Diagnosis Date Noted  . Current moderate episode of major depressive disorder without prior episode (Page) 11/23/2019  . IUD (intrauterine device) in place 11/23/2019    Past Surgical History:  Procedure Laterality Date  . NO PAST SURGERIES      Family History  Problem Relation Age of Onset  . Diabetes Maternal Uncle     Social History   Socioeconomic History  . Marital status: Single    Spouse name: Not on file  . Number of children: 1  . Years of education: Not on file  . Highest education level: Not on file  Occupational History  . Occupation: [employer]  Tobacco Use  . Smoking status: Never Smoker  . Smokeless tobacco: Never Used  Substance and Sexual Activity  . Alcohol use: No  . Drug use: No  . Sexual activity: Yes    Partners: Male    Birth control/protection: I.U.D.      Comment: mirena  Other Topics Concern  . Not on file  Social History Narrative  . Not on file   Social Determinants of Health   Financial Resource Strain:   . Difficulty of Paying Living Expenses: Not on file  Food Insecurity:   . Worried About Charity fundraiser in the Last Year: Not on file  . Ran Out of Food in the Last Year: Not on file  Transportation Needs:   . Lack of Transportation (Medical): Not on file  . Lack of Transportation (Non-Medical): Not on file  Physical Activity:   . Days of Exercise per Week: Not on file  . Minutes of Exercise per Session: Not on file  Stress:   . Feeling of Stress : Not on file  Social Connections:   . Frequency of Communication with Friends and Family: Not on file  . Frequency of Social Gatherings with Friends and Family: Not on file  . Attends Religious Services: Not on file  . Active Member of Clubs or Organizations: Not on file  . Attends Archivist Meetings: Not on file  . Marital Status: Not on file  Intimate Partner Violence:   . Fear of Current or Ex-Partner: Not on file  . Emotionally Abused: Not on file  . Physically Abused: Not on file  . Sexually Abused: Not  on file     Current Outpatient Medications:  .  buPROPion (WELLBUTRIN XL) 150 MG 24 hr tablet, Take 1 tablet (150 mg total) by mouth daily., Disp: 90 tablet, Rfl: 1 .  escitalopram (LEXAPRO) 5 MG tablet, Take 1 tablet (5 mg total) by mouth daily., Disp: 30 tablet, Rfl: 1 .  levonorgestrel (MIRENA) 20 MCG/24HR IUD, 1 each by Intrauterine route once., Disp: , Rfl:  .  albuterol (PROVENTIL HFA;VENTOLIN HFA) 108 (90 Base) MCG/ACT inhaler, Inhale 2 puffs into the lungs every 6 (six) hours as needed for wheezing or shortness of breath. (Patient not taking: Reported on 12/24/2019), Disp: 1 Inhaler, Rfl: 2 .  medroxyPROGESTERone Acetate 150 MG/ML SUSY, INJECT 1 ML (150 MG TOTAL) INTO THE MUSCLE EVERY 3 MONTHS (Patient not taking: Reported on 06/13/2019), Disp: 1 mL,  Rfl: 0  No Known Allergies  I personally reviewed active problem list, medication list, allergies, notes from last encounter, lab results with the patient/caregiver today.   ROS  Constitutional: Negative for fever or weight change.  Respiratory: Negative for cough and shortness of breath.   Cardiovascular: Negative for chest pain or palpitations.  Gastrointestinal: Negative for abdominal pain, no bowel changes.  Musculoskeletal: Negative for gait problem or joint swelling.  Skin: Negative for rash.  Neurological: Negative for dizziness or headache.  No other specific complaints in a complete review of systems (except as listed in HPI above).  Objective  Vitals:   12/24/19 0846  BP: 118/72  Pulse: 73  Resp: 16  Temp: 97.9 F (36.6 C)  TempSrc: Temporal  SpO2: 99%  Weight: 211 lb 8 oz (95.9 kg)  Height: 5\' 3"  (1.6 m)    Body mass index is 37.47 kg/m.  Physical Exam  Constitutional: Patient appears well-developed and well-nourished. No distress.  HENT: Head: Normocephalic and atraumatic.  Eyes: Conjunctivae and EOM are normal. No scleral icterus.  Neck: Normal range of motion. Neck supple. No JVD present. Cardiovascular: Normal rate, regular rhythm and normal heart sounds.  No murmur heard. No BLE edema. Pulmonary/Chest: Effort normal and breath sounds normal. No respiratory distress. Musculoskeletal: Normal range of motion, no joint effusions. No gross deformities Neurological: Pt is alert and oriented to person, place, and time. No cranial nerve deficit. Coordination, balance, strength, speech and gait are normal.  Skin: Skin is warm and dry. No rash noted. No erythema.  Psychiatric: Patient has a normal mood and affect. behavior is normal. Judgment and thought content normal.  No results found for this or any previous visit (from the past 72 hour(s)).   PHQ2/9: Depression screen [facility] 2/9 12/24/2019 11/23/2019 06/13/2019 05/09/2019 02/23/2018  Decreased Interest 1 2 0 3  0  Down, Depressed, Hopeless 2 2 1 1 1   PHQ - 2 Score 3 4 1 4 1   Altered sleeping 2 3 0 3 1  Tired, decreased energy 1 1 0 3 1  Change in appetite 1 2 0 3 2  Feeling bad or failure about yourself  0 0 0 1 0  Trouble concentrating 2 1 0 1 0  Moving slowly or fidgety/restless 0 0 0 0 0  Suicidal thoughts 0 0 0 0 0  PHQ-9 Score 9 11 1 15 5   Difficult doing work/chores Somewhat difficult Somewhat difficult Not difficult at all Extremely dIfficult Not difficult at all   PHQ-2/9 Result is positive.    Fall Risk: Fall Risk  12/24/2019 11/23/2019  Falls in the past year? 0 0  Number falls in past yr: 0  0  Injury with Fall? 0 0  Follow up Falls evaluation completed Falls evaluation completed   Assessment & Plan 1. Current moderate episode of major depressive disorder without prior episode (HCC) - Has CPE in about 4 weeks - will consider increasing to 10mg  if PHQ-9 score is not improving.  Continue Wellbutrin - escitalopram (LEXAPRO) 5 MG tablet; Take 1 tablet (5 mg total) by mouth daily.  Dispense: 30 tablet; Refill: 0  2. Class 2 obesity due to excess calories without serious comorbidity with body mass index (BMI) of 37.0 to 37.9 in adult - Discussed importance of 150 minutes of physical activity weekly, eat two servings of fish weekly, eat one serving of tree nuts ( cashews, pistachios, pecans, almonds. ) every other day, eat 6 servings of fruit/vegetables daily and drink plenty of water and avoid sweet beverages.

## 2020-01-21 ENCOUNTER — Ambulatory Visit: Payer: Medicaid Other | Admitting: Family Medicine

## 2020-01-21 ENCOUNTER — Encounter: Payer: Self-pay | Admitting: Family Medicine

## 2020-01-21 ENCOUNTER — Other Ambulatory Visit (HOSPITAL_COMMUNITY)
Admission: RE | Admit: 2020-01-21 | Discharge: 2020-01-21 | Disposition: A | Discharge status: Home / Self Care | Payer: Medicaid Other | Source: Ambulatory Visit | Attending: Family Medicine | Admitting: Family Medicine

## 2020-01-21 ENCOUNTER — Other Ambulatory Visit: Payer: Self-pay

## 2020-01-21 VITALS — BP 110/74 | HR 86 | Temp 98.3°F | Resp 14 | Ht 63.0 in | Wt 205.9 lb

## 2020-01-21 DIAGNOSIS — Z0000 Encounter for general adult medical examination without abnormal findings: Secondary | ICD-10-CM | POA: Insufficient documentation

## 2020-01-21 DIAGNOSIS — Z975 Presence of (intrauterine) contraceptive device: Secondary | ICD-10-CM

## 2020-01-21 DIAGNOSIS — Z01419 Encounter for gynecological examination (general) (routine) without abnormal findings: Secondary | ICD-10-CM

## 2020-01-21 DIAGNOSIS — Z5181 Encounter for therapeutic drug level monitoring: Secondary | ICD-10-CM

## 2020-01-21 DIAGNOSIS — E6609 Other obesity due to excess calories: Secondary | ICD-10-CM | POA: Diagnosis not present

## 2020-01-21 DIAGNOSIS — Z124 Encounter for screening for malignant neoplasm of cervix: Secondary | ICD-10-CM

## 2020-01-21 DIAGNOSIS — Z113 Encounter for screening for infections with a predominantly sexual mode of transmission: Secondary | ICD-10-CM | POA: Diagnosis not present

## 2020-01-21 DIAGNOSIS — F321 Major depressive disorder, single episode, moderate: Secondary | ICD-10-CM

## 2020-01-21 DIAGNOSIS — Z6836 Body mass index (BMI) 36.0-36.9, adult: Secondary | ICD-10-CM | POA: Diagnosis not present

## 2020-01-21 NOTE — Patient Instructions (Addendum)
Preventive Care 21-27 Years Old, Female Preventive care refers to visits with your health care provider and lifestyle choices that can promote health and wellness. This includes:  A yearly physical exam. This may also be called an annual well check.  Regular dental visits and eye exams.  Immunizations.  Screening for certain conditions.  Healthy lifestyle choices, such as eating a healthy diet, getting regular exercise, not using drugs or products that contain nicotine and tobacco, and limiting alcohol use. What can I expect for my preventive care visit? Physical exam Your health care provider will check your:  Height and weight. This may be used to calculate body mass index (BMI), which tells if you are at a healthy weight.  Heart rate and blood pressure.  Skin for abnormal spots. Counseling Your health care provider may ask you questions about your:  Alcohol, tobacco, and drug use.  Emotional well-being.  Home and relationship well-being.  Sexual activity.  Eating habits.  Work and work environment.  Method of birth control.  Menstrual cycle.  Pregnancy history. What immunizations do I need?  Influenza (flu) vaccine  This is recommended every year. Tetanus, diphtheria, and pertussis (Tdap) vaccine  You may need a Td booster every 10 years. Varicella (chickenpox) vaccine  You may need this if you have not been vaccinated. Human papillomavirus (HPV) vaccine  If recommended by your health care provider, you may need three doses over 6 months. Measles, mumps, and rubella (MMR) vaccine  You may need at least one dose of MMR. You may also need a second dose. Meningococcal conjugate (MenACWY) vaccine  One dose is recommended if you are age 19-21 years and a first-year college student living in a residence hall, or if you have one of several medical conditions. You may also need additional booster doses. Pneumococcal conjugate (PCV13) vaccine  You may need  this if you have certain conditions and were not previously vaccinated. Pneumococcal polysaccharide (PPSV23) vaccine  You may need one or two doses if you smoke cigarettes or if you have certain conditions. Hepatitis A vaccine  You may need this if you have certain conditions or if you travel or work in places where you may be exposed to hepatitis A. Hepatitis B vaccine  You may need this if you have certain conditions or if you travel or work in places where you may be exposed to hepatitis B. Haemophilus influenzae type b (Hib) vaccine  You may need this if you have certain conditions. You may receive vaccines as individual doses or as more than one vaccine together in one shot (combination vaccines). Talk with your health care provider about the risks and benefits of combination vaccines. What tests do I need?  Blood tests  Lipid and cholesterol levels. These may be checked every 5 years starting at age 20.  Hepatitis C test.  Hepatitis B test. Screening  Diabetes screening. This is done by checking your blood sugar (glucose) after you have not eaten for a while (fasting).  Sexually transmitted disease (STD) testing.  BRCA-related cancer screening. This may be done if you have a family history of breast, ovarian, tubal, or peritoneal cancers.  Pelvic exam and Pap test. This may be done every 3 years starting at age 21. Starting at age 30, this may be done every 5 years if you have a Pap test in combination with an HPV test. Talk with your health care provider about your test results, treatment options, and if necessary, the need for more tests.   Follow these instructions at home: °Eating and drinking ° °· Eat a diet that includes fresh fruits and vegetables, whole grains, lean protein, and low-fat dairy. °· Take vitamin and mineral supplements as recommended by your health care provider. °· Do not drink alcohol if: °? Your health care provider tells you not to drink. °? You are  pregnant, may be pregnant, or are planning to become pregnant. °· If you drink alcohol: °? Limit how much you have to 0-1 drink a day. °? Be aware of how much alcohol is in your drink. In the [place] one drink equals one 12 oz bottle of beer (355 mL), one 5 oz glass of wine (148 mL), or one 1½ oz glass of hard liquor (44 mL). °Lifestyle °· Take daily care of your teeth and gums. °· Stay active. Exercise for at least 30 minutes on 5 or more days each week. °· Do not use any products that contain nicotine or tobacco, such as cigarettes, e-cigarettes, and chewing tobacco. If you need help quitting, ask your health care provider. °· If you are sexually active, practice safe sex. Use a condom or other form of birth control (contraception) in order to prevent pregnancy and STIs (sexually transmitted infections). If you plan to become pregnant, see your health care provider for a preconception visit. °What's next? °· Visit your health care provider once a year for a well check visit. °· Ask your health care provider how often you should have your eyes and teeth checked. °· Stay up to date on all vaccines. °This information is not intended to replace advice given to you by your health care provider. Make sure you discuss any questions you have with your health care provider. °Document Revised: 08/10/2018 Document Reviewed: 08/10/2018 °Elsevier Patient Education © 2020 [employer] ° °

## 2020-01-21 NOTE — Progress Notes (Signed)
Patient: Lauren Mercer, Female    DOB: 09/11/1991, 29 y.o.   MRN: [id number] Lauren Hartshorn, FNP Visit Date: 01/21/2020  Today's Provider: Delsa Grana, PA-C   Chief Complaint  Patient presents with  . Annual Exam    with pap   Subjective:   Annual physical exam:  Lauren Mercer is a 29 y.o. female who presents today for complete physical exam:  Exercise/Activity:  Working a lot 50-60 hours a week  Diet/nutrition:  herbalife supplements/shakes, she eats healthy snacks and colorful   Sleep:  Sleeping better   Wt Readings from Last 5 Encounters:  01/21/20 205 lb 14.4 oz (93.4 kg)  12/24/19 211 lb 8 oz (95.9 kg)  11/23/19 209 lb 12.8 oz (95.2 kg)  07/25/19 210 lb 4.8 oz (95.4 kg)  06/13/19 218 lb (98.9 kg)   USPSTF grade A and B recommendations - reviewed and addressed today  Depression:  Phq 9 completed today by patient, was reviewed by me with patient in the room, score is  negative, pt feels good, mood is better with wellbutrin and lexapro PHQ 2/9 Scores 01/21/2020 12/24/2019 11/23/2019 06/13/2019  PHQ - 2 Score 0 _0 PHQ- 9 Score 0 _1 Depression screen [facility] 2/9 01/21/2020 12/24/2019 11/23/2019 06/13/2019 05/09/2019  Decreased Interest 0 1 2 0 3  Down, Depressed, Hopeless 0 _2 PHQ - 2 Score 0 _3 Altered sleeping 0 2 3 0 3  Tired, decreased energy 0 1 1 0 3  Change in appetite 0 1 2 0 3  Feeling bad or failure about yourself  0 0 0 0 1  Trouble concentrating 0 2 1 0 1  Moving slowly or fidgety/restless 0 0 0 0 0  Suicidal thoughts 0 0 0 0 0  PHQ-9 Score 0 _4 Difficult doing work/chores Not difficult at all Somewhat difficult Somewhat difficult Not difficult at all Extremely dIfficult    Alcohol screening:   Office Visit from 01/21/2020 in [facility]  AUDIT-C Score  0      Immunizations and Health Maintenance: Health Maintenance  Topic Date Due  . PAP-Cervical Cytology Screening  10/13/2018  . PAP  SMEAR-Modifier  10/13/2018  . TETANUS/TDAP  04/28/2027  . INFLUENZA VACCINE  Completed  . HIV Screening  Completed     Hep C Screening: n/a  STD testing and prevention (HIV/chl/gon/syphilis): HIV and RPR done, doing remainder of stds today with pap  Intimate partner violence:  Feels safe in relationship and at her home  Sexual History/Pain during Intercourse:  denies One female partner 11 years   Menstrual History/LMP/Abnormal Bleeding:  None since IUD No LMP recorded. (Menstrual status: IUD).  Incontinence Symptoms: none, denies   Breast cancer:    Last Mammogram: n/a for age BRCA gene screening: unknoan  Cervical cancer screening: doing today, last in 2016  Family hx of cancers - breast, ovarian, uterine, colon:   Pt denies any family hx  Osteoporosis:   Discussed high calcium and vitamin D supplementation, weight bearing exercises  Skin cancer:  Hx of skin CA -  NO - Discussed atypical lesions   Colorectal cancer:   colonoscopy is not due for age, no change to BM, denies melena hematochezia    Lung cancer:   Low Dose CT Chest recommended if Age 51-80 years, 30 pack-year currently smoking OR have quit w/in 15years. Patient does not qualify.  Social History   Tobacco Use  . Smoking status: Never Smoker  . Smokeless tobacco: Never Used  Substance Use Topics  . Alcohol use: No     ECG: not indicated  Blood pressure/Hypertension: BP Readings from Last 3 Encounters:  01/21/20 110/74  12/24/19 118/72  11/23/19 120/80    Weight/Obesity: Wt Readings from Last 3 Encounters:  01/21/20 205 lb 14.4 oz (93.4 kg)  12/24/19 211 lb 8 oz (95.9 kg)  11/23/19 209 lb 12.8 oz (95.2 kg)   BMI Readings from Last 3 Encounters:  01/21/20 36.47 kg/m  12/24/19 37.47 kg/m  11/23/19 37.16 kg/m     Lipids:  Lab Results  Component Value Date   CHOL 149 11/23/2019   Lab Results  Component Value Date   HDL 46 (L) 11/23/2019   Lab Results  Component Value Date    LDLCALC 90 11/23/2019   Lab Results  Component Value Date   TRIG 50 11/23/2019   Lab Results  Component Value Date   CHOLHDL 3.2 11/23/2019   No results found for: LDLDIRECT Based on the results of lipid panel his/her cardiovascular risk factor ( using [place] )  in the next 10 years is: The ASCVD Risk score Mikey Bussing DC Jr., et al., 2013) failed to calculate for the following reasons:   The 2013 ASCVD risk score is only valid for ages 54 to 31  Glucose:  Glucose  Date Value Ref Range Status  03/23/2012 87 65 - 99 mg/dL Final   Glucose, Bld  Date Value Ref Range Status  11/23/2019 80 65 - 99 mg/dL Final    Comment:    .            Fasting reference interval .   11/04/2017 97 65 - 99 mg/dL Final  11/27/2015 95 65 - 99 mg/dL Final      [facility] Visit from 01/21/2020 in [facility]  AUDIT-C Score  0     Depression: Phq 9 is  negative Depression screen [facility] 2/9 01/21/2020 12/24/2019 11/23/2019 06/13/2019 05/09/2019  Decreased Interest 0 1 2 0 3  Down, Depressed, Hopeless 0 _0 PHQ - 2 Score 0 _1 Altered sleeping 0 2 3 0 3  Tired, decreased energy 0 1 1 0 3  Change in appetite 0 1 2 0 3  Feeling bad or failure about yourself  0 0 0 0 1  Trouble concentrating 0 2 1 0 1  Moving slowly or fidgety/restless 0 0 0 0 0  Suicidal thoughts 0 0 0 0 0  PHQ-9 Score 0 _2 Difficult doing work/chores Not difficult at all Somewhat difficult Somewhat difficult Not difficult at all Extremely dIfficult   Hypertension: BP Readings from Last 3 Encounters:  01/21/20 110/74  12/24/19 118/72  11/23/19 120/80   Obesity: Wt Readings from Last 3 Encounters:  01/21/20 205 lb 14.4 oz (93.4 kg)  12/24/19 211 lb 8 oz (95.9 kg)  11/23/19 209 lb 12.8 oz (95.2 kg)   BMI Readings from Last 3 Encounters:  01/21/20 36.47 kg/m  12/24/19 37.47 kg/m  11/23/19 37.16 kg/m      Advanced Care Planning:  Patient does not have a living will at present time.    Social History      She  reports that she has never smoked. She has never used smokeless tobacco. She reports that she does not drink alcohol or use drugs.  Social History   Socioeconomic History  . Marital status: Single    Spouse name: Not on file  . Number of children: 1  . Years of education: Not on file  . Highest education level: Not on file  Occupational History  . Occupation: [employer]  Tobacco Use  . Smoking status: Never Smoker  . Smokeless tobacco: Never Used  Substance and Sexual Activity  . Alcohol use: No  . Drug use: No  . Sexual activity: Yes    Partners: Male    Birth control/protection: I.U.D.    Comment: mirena  Other Topics Concern  . Not on file  Social History Narrative  . Not on file   Social Determinants of Health   Financial Resource Strain:   . Difficulty of Paying Living Expenses: Not on file  Food Insecurity:   . Worried About Charity fundraiser in the Last Year: Not on file  . Ran Out of Food in the Last Year: Not on file  Transportation Needs:   . Lack of Transportation (Medical): Not on file  . Lack of Transportation (Non-Medical): Not on file  Physical Activity:   . Days of Exercise per Week: Not on file  . Minutes of Exercise per Session: Not on file  Stress:   . Feeling of Stress : Not on file  Social Connections:   . Frequency of Communication with Friends and Family: Not on file  . Frequency of Social Gatherings with Friends and Family: Not on file  . Attends Religious Services: Not on file  . Active Member of Clubs or Organizations: Not on file  . Attends Archivist Meetings: Not on file  . Marital Status: Not on file    Family History        Family Status  Relation Name Status  . Mat Uncle half brother (Not Specified)        Her family history includes Diabetes in her maternal uncle.       Family History  Problem Relation Age of Onset  . Diabetes Maternal Uncle     Patient Active Problem List    Diagnosis Date Noted  . Class 2 obesity due to excess calories without serious comorbidity with body mass index (BMI) of 37.0 to 37.9 in adult 12/24/2019  . Current moderate episode of major depressive disorder without prior episode ([place]) 11/23/2019  . IUD (intrauterine device) in place 11/23/2019    Past Surgical History:  Procedure Laterality Date  . NO PAST SURGERIES       Current Outpatient Medications:  .  buPROPion (WELLBUTRIN XL) 150 MG 24 hr tablet, Take 1 tablet (150 mg total) by mouth daily., Disp: 90 tablet, Rfl: 1 .  escitalopram (LEXAPRO) 5 MG tablet, Take 1 tablet (5 mg total) by mouth daily., Disp: 30 tablet, Rfl: 0 .  levonorgestrel (MIRENA) 20 MCG/24HR IUD, 1 each by Intrauterine route once., Disp: , Rfl:   No Known Allergies  Patient Care Team: Lauren Hartshorn, FNP as PCP - General (Family Medicine)  Review of Systems  Constitutional: Negative.  Negative for activity change, appetite change, fatigue and unexpected weight change.  HENT: Negative.   Eyes: Negative.   Respiratory: Negative.  Negative for shortness of breath.   Cardiovascular: Negative.  Negative for chest pain, palpitations and leg swelling.  Gastrointestinal: Negative.  Negative for abdominal pain and blood in stool.  Endocrine: Negative.   Genitourinary: Negative.   Musculoskeletal: Negative.  Negative for arthralgias, gait problem, joint swelling  and myalgias.  Skin: Negative.  Negative for color change, pallor and rash.  Allergic/Immunologic: Negative.   Neurological: Negative.  Negative for syncope and weakness.  Hematological: Negative.   Psychiatric/Behavioral: Negative.  Negative for confusion, dysphoric mood, self-injury and suicidal ideas. The patient is not nervous/anxious.   All other systems reviewed and are negative.    I personally reviewed active problem list, medication list, allergies, family history, social history, health maintenance, notes from last encounter, lab results,  imaging with the patient/caregiver today.      Objective:   Vitals:  Vitals:   01/21/20 1032  BP: 110/74  Pulse: 86  Resp: 14  Temp: 98.3 F (36.8 C)  SpO2: 99%  Weight: 205 lb 14.4 oz (93.4 kg)  Height: _0  (1.6 m)    Body mass index is 36.47 kg/m.  Physical Exam Vitals and nursing note reviewed. Exam conducted with a chaperone present.  Constitutional:      General: She is not in acute distress.    Appearance: Normal appearance. She is well-developed. She is obese. She is not ill-appearing, toxic-appearing or diaphoretic.  HENT:     Head: Normocephalic and atraumatic.     Right Ear: External ear normal.     Left Ear: External ear normal.     Nose: Nose normal.     Mouth/Throat:     Pharynx: Uvula midline.  Eyes:     General: Lids are normal.     Conjunctiva/sclera: Conjunctivae normal.     Pupils: Pupils are equal, round, and reactive to light.  Neck:     Trachea: Phonation normal. No tracheal deviation.  Cardiovascular:     Rate and Rhythm: Normal rate and regular rhythm.     Pulses: Normal pulses.          Radial pulses are 2+ on the right side and 2+ on the left side.       Posterior tibial pulses are 2+ on the right side and 2+ on the left side.     Heart sounds: Normal heart sounds. No murmur. No friction rub. No gallop.   Pulmonary:     Effort: Pulmonary effort is normal. No respiratory distress.     Breath sounds: Normal breath sounds. No stridor. No wheezing, rhonchi or rales.  Chest:     Chest wall: No tenderness.     Breasts:        Right: Normal.        Left: Normal.     Comments: Breast exam done and normal Abdominal:     General: Bowel sounds are normal. There is no distension.     Palpations: Abdomen is soft.     Tenderness: There is no abdominal tenderness. There is no guarding or rebound.  Genitourinary:    Labia:        Right: No rash, tenderness or lesion.        Left: No rash or tenderness.      Cervix: Discharge and friability  present. No cervical motion tenderness, lesion, erythema or eversion.     Uterus: Normal.      Adnexa: Right adnexa normal and left adnexa normal.       Right: No mass, tenderness or fullness.         Left: No mass, tenderness or fullness.       Comments: PAP done -moderate amount of white purulent discharge, cervix was friable after Pap sample obtained, IUD strings visible and palpable Musculoskeletal:  General: No deformity. Normal range of motion.     Cervical back: Normal range of motion and neck supple.  Lymphadenopathy:     Cervical: No cervical adenopathy.     Upper Body:     Right upper body: No supraclavicular, axillary or pectoral adenopathy.     Left upper body: No supraclavicular, axillary or pectoral adenopathy.  Skin:    General: Skin is warm and dry.     Capillary Refill: Capillary refill takes less than 2 seconds.     Coloration: Skin is not pale.     Findings: No rash.  Neurological:     Mental Status: She is alert and oriented to person, place, and time.     Motor: No abnormal muscle tone.     Gait: Gait normal.  Psychiatric:        Speech: Speech normal.        Behavior: Behavior normal.       Fall Risk: Fall Risk  01/21/2020 12/24/2019 11/23/2019  Falls in the past year? 0 0 0  Number falls in past yr: 0 0 0  Injury with Fall? 0 0 0  Follow up - Falls evaluation completed Falls evaluation completed    Functional Status Survey: Is the patient deaf or have difficulty hearing?: No Does the patient have difficulty seeing, even when wearing glasses/contacts?: No Does the patient have difficulty concentrating, remembering, or making decisions?: No Does the patient have difficulty walking or climbing stairs?: No Does the patient have difficulty dressing or bathing?: No Does the patient have difficulty doing errands alone such as visiting a doctor's office or shopping?: No   Assessment & Plan:    CPE completed today  . USPSTF grade A and B  recommendations reviewed with patient; age-appropriate recommendations, preventive care, screening tests, etc discussed and encouraged; healthy living encouraged; see AVS for patient education given to patient  . Discussed importance of 150 minutes of physical activity weekly, AHA exercise recommendations given to pt in AVS/handout  . Discussed importance of healthy diet:  eating lean meats and proteins, avoiding trans fats and saturated fats, avoid simple sugars and excessive carbs in diet, eat 6 servings of fruit/vegetables daily and drink plenty of water and avoid sweet beverages.    . Recommended pt to do annual eye exam and routine dental exams/cleanings  . Depression, alcohol, fall screening completed as documented above and per flowsheets  . Reviewed Health Maintenance: Health Maintenance  Topic Date Due  . PAP-Cervical Cytology Screening  10/13/2018  . PAP SMEAR-Modifier  10/13/2018  . TETANUS/TDAP  04/28/2027  . INFLUENZA VACCINE  Completed  . HIV Screening  Completed    . Immunizations: Immunization History  Administered Date(s) Administered  . Influenza,inj,Quad PF,6+ Mos 11/23/2019  . MMR 07/29/2017  . Tdap 04/27/2017     ICD-10-CM   1. Well woman exam with routine gynecological exam  Z01.419 Cytology - PAP  2. Encounter for medication monitoring  Z51.81    doing well with current meds  3. Screening for STD (sexually transmitted disease)  Z11.3 Cytology - PAP   low risk, one female partner for 11 years - screen for STDs and then would not need unless new partner or new risk  4. Screening for malignant neoplasm of cervix  Z12.4 Cytology - PAP   PAP done today  5. Class 2 obesity due to excess calories without serious comorbidity with body mass index (BMI) of 36.0 to 36.9 in adult  E66.09    Z68.36  she has lost some weight, counseled on diet, calorie deficit and exercise  6. IUD (intrauterine device) in place  Z97.5    doing well with it, strings visible  7. Current  moderate episode of major depressive disorder without prior episode ([place])  F32.1    well controlled with current meds     Delsa Grana, PA-C 01/21/20 12:55 PM  [facility]

## 2020-01-23 LAB — CYTOLOGY - PAP
Chlamydia: NEGATIVE
Comment: NEGATIVE
Comment: NEGATIVE
Comment: NEGATIVE
Comment: NORMAL
Diagnosis: NEGATIVE
High risk HPV: NEGATIVE
Neisseria Gonorrhea: NEGATIVE
Trichomonas: NEGATIVE

## 2020-04-08 ENCOUNTER — Other Ambulatory Visit: Payer: Self-pay

## 2020-04-08 ENCOUNTER — Emergency Department
Admission: EM | Admit: 2020-04-08 | Discharge: 2020-04-08 | Disposition: A | Discharge status: Home / Self Care | Payer: Medicaid Other | Attending: Emergency Medicine | Admitting: Emergency Medicine

## 2020-04-08 ENCOUNTER — Encounter: Payer: Self-pay | Admitting: Emergency Medicine

## 2020-04-08 DIAGNOSIS — W228XXA Striking against or struck by other objects, initial encounter: Secondary | ICD-10-CM | POA: Insufficient documentation

## 2020-04-08 DIAGNOSIS — Z23 Encounter for immunization: Secondary | ICD-10-CM | POA: Insufficient documentation

## 2020-04-08 DIAGNOSIS — S79921A Unspecified injury of right thigh, initial encounter: Secondary | ICD-10-CM | POA: Diagnosis present

## 2020-04-08 DIAGNOSIS — S71111A Laceration without foreign body, right thigh, initial encounter: Secondary | ICD-10-CM | POA: Insufficient documentation

## 2020-04-08 DIAGNOSIS — Y929 Unspecified place or not applicable: Secondary | ICD-10-CM | POA: Diagnosis not present

## 2020-04-08 DIAGNOSIS — Y939 Activity, unspecified: Secondary | ICD-10-CM | POA: Insufficient documentation

## 2020-04-08 DIAGNOSIS — Y999 Unspecified external cause status: Secondary | ICD-10-CM | POA: Insufficient documentation

## 2020-04-08 MED ORDER — TETANUS-DIPHTH-ACELL PERTUSSIS 5-2.5-18.5 LF-MCG/0.5 IM SUSP
0.5000 mL | Freq: Once | INTRAMUSCULAR | Status: AC
  Administered 2020-04-08: 0.5 mL via INTRAMUSCULAR
  Filled 2020-04-08 (×2): qty 0.5

## 2020-04-08 NOTE — ED Triage Notes (Signed)
Presents with superficial laceration to right upper leg    States she hit her leg on an air conditioner

## 2020-04-08 NOTE — ED Provider Notes (Signed)
[facility] Emergency Department Provider Note  ____________________________________________   First MD Initiated Contact with Patient 04/08/20 1443     (approximate)  I have reviewed the triage vital signs and the nursing notes.   HISTORY  Chief Complaint Laceration    HPI Lauren Mercer is a 29 y.o. female presents emergency department laceration to the right upper leg.  Hit her leg on the air conditioner.  Clean the wound and then her grandmother told her to come to the ED she may need a tetanus shot.    Past Medical History:  Diagnosis Date  . Amenorrhea 11/30/2016  . Asthma   . Medical history non-contributory     Patient Active Problem List   Diagnosis Date Noted  . Class 2 obesity due to excess calories without serious comorbidity with body mass index (BMI) of 37.0 to 37.9 in adult 12/24/2019  . Current moderate episode of major depressive disorder without prior episode ([place]) 11/23/2019  . IUD (intrauterine device) in place 11/23/2019    Past Surgical History:  Procedure Laterality Date  . NO PAST SURGERIES      Prior to Admission medications   Medication Sig Start Date End Date Taking? Authorizing Provider  buPROPion (WELLBUTRIN XL) 150 MG 24 hr tablet Take 1 tablet (150 mg total) by mouth daily. 11/23/19   Hubbard Hartshorn, FNP  escitalopram (LEXAPRO) 5 MG tablet Take 1 tablet (5 mg total) by mouth daily. 12/24/19   Hubbard Hartshorn, FNP  levonorgestrel (MIRENA) 20 MCG/24HR IUD 1 each by Intrauterine route once.    [provider]    Allergies Patient has no known allergies.  Family History  Problem Relation Age of Onset  . Diabetes Maternal Uncle     Social History Social History   Tobacco Use  . Smoking status: Never Smoker  . Smokeless tobacco: Never Used  Substance Use Topics  . Alcohol use: No  . Drug use: No    Review of Systems  Constitutional: No fever/chills Eyes: No visual changes. ENT: No sore  throat. Respiratory: Denies cough Genitourinary: Negative for dysuria. Musculoskeletal: Negative for back pain. Skin: Negative for rash.  Positive for abrasion/laceration to the right thigh Psychiatric: no mood changes,     ____________________________________________   PHYSICAL EXAM:  VITAL SIGNS: ED Triage Vitals  Enc Vitals Group     BP 04/08/20 1445 138/78     Pulse Rate 04/08/20 1445 80     Resp 04/08/20 1445 18     Temp 04/08/20 1445 98 F (36.7 C)     Temp Source 04/08/20 1445 Oral     SpO2 04/08/20 1445 98 %     Weight 04/08/20 1447 204 lb (92.5 kg)     Height 04/08/20 1447 5\' 3"  (1.6 m)     Head Circumference --      Peak Flow --      Pain Score 04/08/20 1447 0     Pain Loc --      Pain Edu? --      Excl. in [place]? --     Constitutional: Alert and oriented. Well appearing and in no acute distress. Eyes: Conjunctivae are normal.  Head: Atraumatic. Nose: No congestion/rhinnorhea. Mouth/Throat: Mucous membranes are moist.   Neck:  supple no lymphadenopathy noted Cardiovascular: Normal rate, regular rhythm. Respiratory: Normal respiratory effort.  No retractions, GU: deferred Musculoskeletal: FROM all extremities, warm and well perfused Neurologic:  Normal speech and language.  Skin:  Skin is warm,  dry, positive for an abrasion to the right thigh, no active bleeding, no rash noted. Psychiatric: Mood and affect are normal. Speech and behavior are normal.  ____________________________________________   LABS (all labs ordered are listed, but only abnormal results are displayed)  Labs Reviewed - No data to display ____________________________________________   ____________________________________________  RADIOLOGY    ____________________________________________   PROCEDURES  Procedure(s) performed: No  Procedures    ____________________________________________   INITIAL IMPRESSION / ASSESSMENT AND PLAN / ED COURSE  Pertinent labs & imaging  results that were available during my care of the patient were reviewed by me and considered in my medical decision making (see chart for details).   Patient is 29 year old female presents emergency department abrasion to the right thigh.  See HPI  Physical exam shows patient to appear well.  Abrasion noted to the right thigh.  Tdap updated in the ED.  Wound care explained to the patient.  Dressing was applied she was discharged stable condition.    Lauren Mercer was evaluated in Emergency Department on 04/08/2020 for the symptoms described in the history of present illness. She was evaluated in the context of the global COVID-19 pandemic, which necessitated consideration that the patient might be at risk for infection with the SARS-CoV-2 virus that causes COVID-19. Institutional protocols and algorithms that pertain to the evaluation of patients at risk for COVID-19 are in a state of rapid change based on information released by regulatory bodies including the CDC and federal and state organizations. These policies and algorithms were followed during the patient's care in the ED.   As part of my medical decision making, I reviewed the following data within the electronic MEDICAL RECORD NUMBER Nursing notes reviewed and incorporated, Old chart reviewed, Notes from prior ED visits and [place] Controlled Substance Database  ____________________________________________   FINAL CLINICAL IMPRESSION(S) / ED DIAGNOSES  Final diagnoses:  Laceration of right thigh, initial encounter      NEW MEDICATIONS STARTED DURING THIS VISIT:  New Prescriptions   No medications on file     Note:  This document was prepared using Dragon voice recognition software and may include unintentional dictation errors.    Faythe Ghee, PA-C 04/08/20 1503    Minna Antis, MD 04/08/20 1520

## 2020-04-08 NOTE — Discharge Instructions (Addendum)
Wash the area with soap and water daily.  Return if any sign of infection.  Keep it covered when in public.

## 2020-04-14 ENCOUNTER — Other Ambulatory Visit: Payer: Self-pay | Admitting: Family Medicine

## 2020-04-14 DIAGNOSIS — F321 Major depressive disorder, single episode, moderate: Secondary | ICD-10-CM

## 2020-06-12 DIAGNOSIS — Z419 Encounter for procedure for purposes other than remedying health state, unspecified: Secondary | ICD-10-CM | POA: Diagnosis not present

## 2020-07-07 ENCOUNTER — Telehealth: Payer: Self-pay

## 2020-07-07 DIAGNOSIS — F321 Major depressive disorder, single episode, moderate: Secondary | ICD-10-CM

## 2020-07-07 MED ORDER — ESCITALOPRAM OXALATE 5 MG PO TABS: 5.0000 mg | ORAL_TABLET | Freq: Every day | ORAL | 0 refills | Status: DC

## 2020-07-07 NOTE — Telephone Encounter (Signed)
Pt needs appt

## 2020-07-07 NOTE — Telephone Encounter (Signed)
MB was full. Patient nedds appt for med refills

## 2020-07-13 DIAGNOSIS — Z419 Encounter for procedure for purposes other than remedying health state, unspecified: Secondary | ICD-10-CM | POA: Diagnosis not present

## 2020-07-14 ENCOUNTER — Emergency Department
Admission: EM | Admit: 2020-07-14 | Discharge: 2020-07-14 | Disposition: A | Discharge status: Home / Self Care | Payer: Worker's Compensation | Attending: Emergency Medicine | Admitting: Emergency Medicine

## 2020-07-14 ENCOUNTER — Other Ambulatory Visit: Payer: Self-pay

## 2020-07-14 ENCOUNTER — Emergency Department: Payer: Worker's Compensation

## 2020-07-14 DIAGNOSIS — Y939 Activity, unspecified: Secondary | ICD-10-CM | POA: Diagnosis not present

## 2020-07-14 DIAGNOSIS — M25462 Effusion, left knee: Secondary | ICD-10-CM | POA: Diagnosis not present

## 2020-07-14 DIAGNOSIS — Y990 Civilian activity done for income or pay: Secondary | ICD-10-CM | POA: Diagnosis not present

## 2020-07-14 DIAGNOSIS — M25569 Pain in unspecified knee: Secondary | ICD-10-CM

## 2020-07-14 DIAGNOSIS — W010XXA Fall on same level from slipping, tripping and stumbling without subsequent striking against object, initial encounter: Secondary | ICD-10-CM | POA: Insufficient documentation

## 2020-07-14 DIAGNOSIS — M25562 Pain in left knee: Secondary | ICD-10-CM | POA: Diagnosis not present

## 2020-07-14 DIAGNOSIS — Y929 Unspecified place or not applicable: Secondary | ICD-10-CM | POA: Diagnosis not present

## 2020-07-14 DIAGNOSIS — J45909 Unspecified asthma, uncomplicated: Secondary | ICD-10-CM | POA: Insufficient documentation

## 2020-07-14 MED ORDER — ACETAMINOPHEN 500 MG PO TABS
500.0000 mg | ORAL_TABLET | Freq: Four times a day (QID) | ORAL | 0 refills | Status: DC | PRN
Start: 2020-07-14 — End: 2021-12-11

## 2020-07-14 NOTE — ED Triage Notes (Signed)
Pt reports falling at work yesterday and slipping on a wet floor and falling. Pt states it happened quickly and does not remember details of the fall but that she fell on the left leg. Pt reports that the left knee is painful with ambulation and her mobility is limited. There is bruising noted on the knee. Pt complains of "dent" dent in the left knee cap.

## 2020-07-14 NOTE — ED Provider Notes (Signed)
[facility] Emergency Department Provider Note  ____________________________________________  Time seen: Approximately 9:58 PM  I have reviewed the triage vital signs and the nursing notes.   HISTORY  Chief Complaint Fall and Knee Pain    HPI Lauren Mercer is a 29 y.o. female that presents to the emergency department for evaluation of left knee pain after a fall at work yesterday.  Patient slipped on water and her left leg went backwards.  She continued to work the rest of the shift.  She immediately developed some bruising and swelling around her kneecap.  She did not hit her head or lose consciousness.  No additional injuries.  She is not on any blood thinners.   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 Authorizing Provider  acetaminophen (TYLENOL) 500 MG tablet Take 1 tablet (500 mg total) by mouth every 6 (six) hours as needed. 07/14/20   Enid Derry, PA-C  buPROPion (WELLBUTRIN XL) 150 MG 24 hr tablet Take 1 tablet (150 mg total) by mouth daily. 11/23/19   Doren Custard, FNP  escitalopram (LEXAPRO) 5 MG tablet Take 1 tablet (5 mg total) by mouth daily. 07/07/20   Danelle Berry, PA-C  levonorgestrel (MIRENA) 20 MCG/24HR IUD 1 each by Intrauterine route once.    [provider]    Allergies Patient has no known allergies.  Family History  Problem Relation Age of Onset   Diabetes  Maternal Uncle     Social History Social History   Tobacco Use   Smoking status: Never Smoker   Smokeless tobacco: Never Used  Building services engineer Use: Never used  Substance Use Topics   Alcohol use: No   Drug use: No     Review of Systems  Gastrointestinal:  No nausea, no vomiting.  Musculoskeletal: Positive for knee pain. Skin: Negative for rash, abrasions, lacerations.  Positive for swelling and ecchymosis. Neurological: Negative for headaches   ____________________________________________   PHYSICAL EXAM:  VITAL SIGNS: ED Triage Vitals  Enc Vitals Group     BP 07/14/20 2115 139/89     Pulse Rate 07/14/20 2115 92     Resp 07/14/20 2115 16     Temp 07/14/20 2115 99.3 F (37.4 C)     Temp Source 07/14/20 2115 Oral     SpO2 07/14/20 2115 98 %     Weight 07/14/20 2116 210 lb (95.3 kg)     Height 07/14/20 2116 5\' 3"  (1.6 m)     Head Circumference --      Peak Flow --      Pain Score 07/14/20 2115 8     Pain Loc --      Pain Edu? --      Excl. in GC? --      Constitutional: Alert and oriented. Well appearing and in no acute distress. Eyes: Conjunctivae are normal. PERRL. EOMI. Head: Atraumatic.  ENT:      Ears:      Nose: No congestion/rhinnorhea.      Mouth/Throat: Mucous membranes are moist.  Neck: No stridor. Cardiovascular: Normal rate, regular rhythm.  Good peripheral circulation. Respiratory: Normal respiratory effort without tachypnea or retractions. Lungs CTAB. Good air entry to the bases with no decreased or absent breath sounds. Musculoskeletal: Full range of motion to all extremities. No gross deformities appreciated.  Swelling to left knee.  Full range of motion of left knee. Neurologic:  Normal speech and language. No gross focal neurologic deficits are appreciated.  Skin:  Skin is warm, dry and intact.  Ecchymosis inferior to left patella. Psychiatric: Mood and affect are normal. Speech and behavior are normal. Patient exhibits appropriate  insight and judgement.   ____________________________________________   LABS (all labs ordered are listed, but only abnormal results are displayed)  Labs Reviewed - No data to display ____________________________________________  EKG   ____________________________________________  RADIOLOGY Lexine Baton, personally viewed and evaluated these images (plain radiographs) as part of my medical decision making, as well as reviewing the written report by the radiologist.  DG Knee Complete 4 Views Left  Result Date: 07/14/2020 CLINICAL DATA:  Status post fall. EXAM: LEFT KNEE - COMPLETE 4+ VIEW COMPARISON:  None. FINDINGS: No evidence of acute fracture or dislocation. No evidence of arthropathy or other focal bone abnormality. A small joint effusion is noted. IMPRESSION: 1. No acute osseous abnormality. 2. Small joint effusion. Electronically Signed   By: Aram Candela M.D.   On: 07/14/2020 21:48    ____________________________________________    PROCEDURES  Procedure(s) performed:    Procedures    Medications - No data to display   ____________________________________________   INITIAL IMPRESSION / ASSESSMENT AND PLAN / ED COURSE  Pertinent labs & imaging results that were available during my care of the patient were reviewed by me and considered in my medical decision making (see chart for details).  Review of the [place] CSRS was performed in accordance of the [facility] prior to dispensing any controlled drugs.     Patient's diagnosis is consistent with joint effusion.  Vital signs and exam are reassuring.  Knee x-ray negative for acute bony abnormality and consistent with a small joint effusion.  Knee immobilizer was placed.  Crutches were given.  Patient will be discharged home with prescriptions for Tylenol. Patient is to follow up with orthopedics as directed. Patient is given ED precautions to return to the ED for any worsening or new symptoms.  Lauren Mercer  was evaluated in Emergency Department on 07/14/2020 for the symptoms described in the history of present illness. She was evaluated in the context of the global COVID-19 pandemic, which necessitated consideration that the patient might be at risk for infection with the SARS-CoV-2 virus that causes COVID-19. Institutional protocols and algorithms that pertain to the evaluation of patients at risk for COVID-19 are in a state of rapid change based on information released by regulatory bodies including the CDC and federal and state organizations. These policies and algorithms were followed during the patient's care in the ED.   ____________________________________________  FINAL CLINICAL IMPRESSION(S) / ED DIAGNOSES  Final diagnoses:  Knee pain  Effusion of left knee      NEW MEDICATIONS STARTED DURING THIS VISIT:  ED Discharge Orders         Ordered    acetaminophen (TYLENOL) 500 MG tablet  Every 6 hours PRN     Discontinue  Reprint  07/14/20 2217              This chart was dictated using voice recognition software/Dragon. Despite best efforts to proofread, errors can occur which can change the meaning. Any change was purely unintentional.    Enid Derry, PA-C 07/14/20 2312    Jene Every, MD 07/15/20 1530

## 2020-07-14 NOTE — Discharge Instructions (Signed)
There is a small joint effusion on your knee x-ray.  Please wear knee immobilizer and use crutches.  Please call primary care orthopedics tomorrow for a follow-up appointment.

## 2020-07-15 ENCOUNTER — Telehealth: Payer: Self-pay | Admitting: *Deleted

## 2020-07-15 NOTE — Telephone Encounter (Signed)
Contacted pt to complete transition of care assessment:  Transition Care Management Follow-up Telephone Call   [facility] Managed Care Transition Call Status:MM [facility] Call Made   Date of discharge and from where[facility], 07[facility]   How have you been since you were released from the hospital? ok  Any questions or concerns? no  Items Reviewed:  Did the pt receive and understand the discharge instructions provided? Yes   Medications obtained and verified? No   Any new allergies since your discharge? No   Dietary orders reviewed? no  Do you have support at home? Yes, family  Functional Questionnaire: (I = Independent and D = Dependent)  ADLs: Independent Bathing/Dressing:Independent Meal Prep: Independent Eating: Independent Maintaining continence: Independent Transferring/Ambulation: Independent Managing Meds: Independent Follow up appointments reviewed:  PCP Hospital f/u appt confirmed? No  Pt to contact Maurice Small on 07/15/20 for follow up appt  Specialist Hospital f/u appt confirmed? Pt to contact Dr Domenic Schwab, Orthopedic Surgery on 07/15/20 for follow up appt  Are transportation arrangements needed? No   If their condition worsens, is the pt aware to call PCP or go to the EmergencyDept.? yes  Was the patient provided with contact information for the PCP's office or ED? yes  Was to pt encouraged to call back with questions or concerns? yes  Burnard Bunting, RN, BSN, CCRN [facility] [phone]

## 2020-07-15 NOTE — Telephone Encounter (Signed)
Attempted to call patient to schedule ED follow up ,unable to leave massage ,VM full.                                                   Lauren Mercer                                                   PEC                                               [phone]

## 2020-08-13 DIAGNOSIS — Z419 Encounter for procedure for purposes other than remedying health state, unspecified: Secondary | ICD-10-CM | POA: Diagnosis not present

## 2020-09-12 DIAGNOSIS — Z419 Encounter for procedure for purposes other than remedying health state, unspecified: Secondary | ICD-10-CM | POA: Diagnosis not present

## 2020-10-13 DIAGNOSIS — Z419 Encounter for procedure for purposes other than remedying health state, unspecified: Secondary | ICD-10-CM | POA: Diagnosis not present

## 2020-11-01 ENCOUNTER — Other Ambulatory Visit: Payer: Self-pay | Admitting: Family Medicine

## 2020-11-01 DIAGNOSIS — F321 Major depressive disorder, single episode, moderate: Secondary | ICD-10-CM

## 2020-11-12 DIAGNOSIS — Z419 Encounter for procedure for purposes other than remedying health state, unspecified: Secondary | ICD-10-CM | POA: Diagnosis not present

## 2020-12-11 ENCOUNTER — Other Ambulatory Visit: Payer: Self-pay | Admitting: Family Medicine

## 2020-12-11 DIAGNOSIS — F321 Major depressive disorder, single episode, moderate: Secondary | ICD-10-CM

## 2020-12-11 MED ORDER — ESCITALOPRAM OXALATE 5 MG PO TABS: 5.0000 mg | ORAL_TABLET | Freq: Every day | ORAL | 0 refills | Status: DC

## 2020-12-11 NOTE — Telephone Encounter (Signed)
Requested medication (s) are due for refill today: Yes  Requested medication (s) are on the active medication list: Yes  Last refill:  11/23/19  Future visit scheduled: Yes  Notes to clinic: Unable to refill per protocol, expired Rx     Requested Prescriptions  Pending Prescriptions Disp Refills   buPROPion (WELLBUTRIN XL) 150 MG 24 hr tablet 90 tablet 1    Sig: Take 1 tablet (150 mg total) by mouth daily.      Psychiatry: Antidepressants - bupropion Failed - 12/11/2020  4:19 PM      Failed - Valid encounter within last 6 months    Recent Outpatient Visits           10 months ago Well woman exam with routine gynecological exam   [facility] Danelle Berry, PA-C   11 months ago Current moderate episode of major depressive disorder without prior episode [facility])   [facility] [place], Irving Burton E, FNP   1 year ago Current moderate episode of major depressive disorder without prior episode [facility])   [facility], Gerome Apley, FNP       Future Appointments             In 3 weeks Danelle Berry, PA-C [facility], [facility]             Passed - Completed PHQ-2 or PHQ-9 in the last 360 days      Passed - Last BP in normal range    BP Readings from Last 1 Encounters:  07/14/20 122/75           Signed Prescriptions Disp Refills   escitalopram (LEXAPRO) 5 MG tablet 30 tablet 0    Sig: Take 1 tablet (5 mg total) by mouth daily. KEEP SCHEDULED APPOINTMENT FOR ADDITIONAL REFILLS      Psychiatry:  Antidepressants - SSRI Failed - 12/11/2020  4:19 PM      Failed - Valid encounter within last 6 months    Recent Outpatient Visits           10 months ago Well woman exam with routine gynecological exam   [facility] Danelle Berry, PA-C   11 months ago Current moderate episode of major depressive disorder without prior episode [facility])   [facility], Gerome Apley, FNP   1 year  ago Current moderate episode of major depressive disorder without prior episode [facility])   [facility] Doren Custard, FNP       Future Appointments             In 3 weeks Danelle Berry, PA-C [facility], [facility]             Passed - Completed PHQ-2 or PHQ-9 in the last 360 days

## 2020-12-11 NOTE — Telephone Encounter (Signed)
Medication Refill - Medication: Bupropion and Escitalopram  Has the patient contacted their pharmacy? Yes.   (Agent: If no, request that the patient contact the pharmacy for the refill.) (Agent: If yes, when and what did the pharmacy advise?)  Preferred Pharmacy (with phone number or street name): [id number] - [place], [place] - [address] Agent: Please be advised that RX refills may take up to 3 business days. We ask that you follow-up with your pharmacy.

## 2020-12-13 DIAGNOSIS — Z419 Encounter for procedure for purposes other than remedying health state, unspecified: Secondary | ICD-10-CM | POA: Diagnosis not present

## 2020-12-17 ENCOUNTER — Ambulatory Visit: Payer: Self-pay | Admitting: *Deleted

## 2020-12-17 NOTE — Telephone Encounter (Signed)
Covid positive as of today. Symptoms: HA, dry cough, low grade fever, decreased appetite. Denies SOB/CP. Discussed isolation requirements, health precautions including monitoring your breathing for any changes/concerns call you pcp or seek treatment at the ED with difficulty breathing. Fluids for hydration and Lysol common household areas often. OTC medications for cough/discomfor/congestion may be taken as directed on the package. With little appetite stick to bites of bland foods if you feel hungry. After isolation ends go back to wearing a mask while you are around others. Call  Back with any additional questions.   Reason for Disposition . [1] HVGWG-65 diagnosed by positive lab test (e.g., PCR, rapid self-test kit) AND [2] mild symptoms (e.g., cough, fever, others) AND [2] no complications or SOB  Protocols used: CORONAVIRUS (COVID-19) DIAGNOSED OR SUSPECTED-A-AH

## 2021-01-05 ENCOUNTER — Ambulatory Visit: Payer: Medicaid Other | Admitting: Family Medicine

## 2021-01-13 DIAGNOSIS — Z419 Encounter for procedure for purposes other than remedying health state, unspecified: Secondary | ICD-10-CM | POA: Diagnosis not present

## 2021-01-22 ENCOUNTER — Ambulatory Visit: Payer: Medicaid Other | Admitting: Family Medicine

## 2021-01-22 DIAGNOSIS — F321 Major depressive disorder, single episode, moderate: Secondary | ICD-10-CM

## 2021-01-30 ENCOUNTER — Ambulatory Visit: Payer: Medicaid Other | Admitting: Family Medicine

## 2021-02-10 DIAGNOSIS — Z419 Encounter for procedure for purposes other than remedying health state, unspecified: Secondary | ICD-10-CM | POA: Diagnosis not present

## 2021-03-13 DIAGNOSIS — Z419 Encounter for procedure for purposes other than remedying health state, unspecified: Secondary | ICD-10-CM | POA: Diagnosis not present

## 2021-04-12 DIAGNOSIS — Z419 Encounter for procedure for purposes other than remedying health state, unspecified: Secondary | ICD-10-CM | POA: Diagnosis not present

## 2021-05-13 DIAGNOSIS — Z419 Encounter for procedure for purposes other than remedying health state, unspecified: Secondary | ICD-10-CM | POA: Diagnosis not present

## 2021-06-12 DIAGNOSIS — Z419 Encounter for procedure for purposes other than remedying health state, unspecified: Secondary | ICD-10-CM | POA: Diagnosis not present

## 2021-07-13 DIAGNOSIS — Z419 Encounter for procedure for purposes other than remedying health state, unspecified: Secondary | ICD-10-CM | POA: Diagnosis not present

## 2021-08-13 DIAGNOSIS — Z419 Encounter for procedure for purposes other than remedying health state, unspecified: Secondary | ICD-10-CM | POA: Diagnosis not present

## 2021-08-19 NOTE — Progress Notes (Deleted)
Name: Lauren Mercer   MRN: [id number]    DOB: 1991/08/12   Date:08/19/2021       Progress Note  Subjective  Chief Complaint  physical   HPI  Patient presents for annual CPE.  Diet: *** Exercise: ***   Flowsheet Row Office Visit from 01/21/2020 in [facility]  AUDIT-C Score 0      Depression: Phq 9 is  {Desc; negative/positive:13464} Depression screen [facility] 2/9 01/21/2020 12/24/2019 11/23/2019 06/13/2019 05/09/2019  Decreased Interest 0 1 2 0 3  Down, Depressed, Hopeless 0 $RemoveBe'2 2 1 1  'NIxycTqMh$ PHQ - 2 Score 0 $Remov'3 4 1 4  'SfMUiQ$ Altered sleeping 0 2 3 0 3  Tired, decreased energy 0 1 1 0 3  Change in appetite 0 1 2 0 3  Feeling bad or failure about yourself  0 0 0 0 1  Trouble concentrating 0 2 1 0 1  Moving slowly or fidgety/restless 0 0 0 0 0  Suicidal thoughts 0 0 0 0 0  PHQ-9 Score 0 $Remov'9 11 1 15  'RPIrum$ Difficult doing work/chores Not difficult at all Somewhat difficult Somewhat difficult Not difficult at all Extremely dIfficult   Hypertension: BP Readings from Last 3 Encounters:  07/14/20 122/75  04/08/20 138/78  01/21/20 110/74   Obesity: Wt Readings from Last 3 Encounters:  07/14/20 210 lb (95.3 kg)  04/08/20 204 lb (92.5 kg)  01/21/20 205 lb 14.4 oz (93.4 kg)   BMI Readings from Last 3 Encounters:  07/14/20 37.20 kg/m  04/08/20 36.14 kg/m  01/21/20 36.47 kg/m     Vaccines:  HPV: up to at age 25 , ask insurance if age between 39-45  Shingrix: 23-64 yo and ask insurance if covered when patient above 83 yo Pneumonia: educated and discussed with patient. Flu: educated and discussed with patient.  Hep C Screening: 11/2019 STD testing and prevention (HIV/chl/gon/syphilis): 11/2019 Intimate partner violence:*** Sexual History : Menstrual History/LMP/Abnormal Bleeding:  Incontinence Symptoms:   Breast cancer:  - Last Mammogram: N/A - BRCA gene screening: ***  Osteoporosis: Discussed high calcium and vitamin D supplementation, weight bearing exercises  Cervical  cancer screening: ***  Skin cancer: Discussed monitoring for atypical lesions  Colorectal cancer: N/A Lung cancer:  Low Dose CT Chest recommended if Age 22-80 years, 20 pack-year currently smoking OR have quit w/in 15years. Patient {DOES NOT does:27190::"does not"} qualify.   ECG: 2013  Advanced Care Planning: A voluntary discussion about advance care planning including the explanation and discussion of advance directives.  Discussed health care proxy and Living will, and the patient was able to identify a health care proxy as ***.  Patient {DOES_DOES LXB:26203} have a living will at present time. If patient does have living will, I have requested they bring this to the clinic to be scanned in to their chart.  Lipids: Lab Results  Component Value Date   CHOL 149 11/23/2019   Lab Results  Component Value Date   HDL 46 (L) 11/23/2019   Lab Results  Component Value Date   LDLCALC 90 11/23/2019   Lab Results  Component Value Date   TRIG 50 11/23/2019   Lab Results  Component Value Date   CHOLHDL 3.2 11/23/2019   No results found for: LDLDIRECT  Glucose: Glucose  Date Value Ref Range Status  03/23/2012 87 65 - 99 mg/dL Final   Glucose, Bld  Date Value Ref Range Status  11/23/2019 80 65 - 99 mg/dL Final    Comment:    .  Fasting reference interval .   11/04/2017 97 65 - 99 mg/dL Final  11/27/2015 95 65 - 99 mg/dL Final    Patient Active Problem List   Diagnosis Date Noted   Class 2 obesity due to excess calories without serious comorbidity with body mass index (BMI) of 37.0 to 37.9 in adult 12/24/2019   Current moderate episode of major depressive disorder without prior episode ([place]) 11/23/2019   IUD (intrauterine device) in place 11/23/2019    Past Surgical History:  Procedure Laterality Date   NO PAST SURGERIES      Family History  Problem Relation Age of Onset   Diabetes Maternal Uncle     Social History   Socioeconomic History   Marital  status: Single    Spouse name: Not on file   Number of children: 1   Years of education: Not on file   Highest education level: Not on file  Occupational History   Occupation: [employer]  Tobacco Use   Smoking status: Never   Smokeless tobacco: Never  Vaping Use   Vaping Use: Never used  Substance and Sexual Activity   Alcohol use: No   Drug use: No   Sexual activity: Yes    Partners: Male    Birth control/protection: I.U.D.    Comment: mirena  Other Topics Concern   Not on file  Social History Narrative   Not on file   Social Determinants of Health   Financial Resource Strain: Not on file  Food Insecurity: Not on file  Transportation Needs: Not on file  Physical Activity: Not on file  Stress: Not on file  Social Connections: Not on file  Intimate Partner Violence: Not on file     Current Outpatient Medications:    acetaminophen (TYLENOL) 500 MG tablet, Take 1 tablet (500 mg total) by mouth every 6 (six) hours as needed., Disp: 30 tablet, Rfl: 0   buPROPion (WELLBUTRIN XL) 150 MG 24 hr tablet, Take 1 tablet (150 mg total) by mouth daily., Disp: 90 tablet, Rfl: 1   escitalopram (LEXAPRO) 5 MG tablet, Take 1 tablet (5 mg total) by mouth daily. KEEP SCHEDULED APPOINTMENT FOR ADDITIONAL REFILLS, Disp: 30 tablet, Rfl: 0   levonorgestrel (MIRENA) 20 MCG/24HR IUD, 1 each by Intrauterine route once., Disp: , Rfl:   No Known Allergies   ROS  ***  Objective  There were no vitals filed for this visit.  There is no height or weight on file to calculate BMI.  Physical Exam ***  No results found for this or any previous visit (from the past 2160 hour(s)).  Diabetic Foot Exam: Diabetic Foot Exam - Simple   No data filed    ***  Fall Risk: Fall Risk  01/21/2020 12/24/2019 11/23/2019  Falls in the past year? 0 0 0  Number falls in past yr: 0 0 0  Injury with Fall? 0 0 0  Follow up - Falls evaluation completed Falls evaluation completed   ***  Functional Status  Survey:   ***  Assessment & Plan     -USPSTF grade A and B recommendations reviewed with patient; age-appropriate recommendations, preventive care, screening tests, etc discussed and encouraged; healthy living encouraged; see AVS for patient education given to patient -Discussed importance of 150 minutes of physical activity weekly, eat two servings of fish weekly, eat one serving of tree nuts ( cashews, pistachios, pecans, almonds.Marland Kitchen) every other day, eat 6 servings of fruit/vegetables daily and drink plenty of water and avoid sweet beverages.   -  Reviewed Health Maintenance: ***

## 2021-08-20 ENCOUNTER — Encounter: Payer: Medicaid Other | Admitting: Nurse Practitioner

## 2021-08-20 DIAGNOSIS — E6609 Other obesity due to excess calories: Secondary | ICD-10-CM

## 2021-08-20 DIAGNOSIS — F321 Major depressive disorder, single episode, moderate: Secondary | ICD-10-CM

## 2021-08-20 DIAGNOSIS — Z0000 Encounter for general adult medical examination without abnormal findings: Secondary | ICD-10-CM

## 2021-08-24 NOTE — Progress Notes (Deleted)
Name: Lauren Mercer   MRN: [id number]    DOB: 1991-11-28   Date:08/24/2021       Progress Note  Subjective  Chief Complaint  Physical   HPI  Patient presents for annual CPE.  Diet: *** Exercise: ***   Flowsheet Row Office Visit from 01/21/2020 in [facility]  AUDIT-C Score 0      Depression: Phq 9 is  {Desc; negative/positive:13464} Depression screen [facility] 2/9 01/21/2020 12/24/2019 11/23/2019 06/13/2019 05/09/2019  Decreased Interest 0 1 2 0 3  Down, Depressed, Hopeless 0 '2 2 1 1  ' PHQ - 2 Score 0 '3 4 1 4  ' Altered sleeping 0 2 3 0 3  Tired, decreased energy 0 1 1 0 3  Change in appetite 0 1 2 0 3  Feeling bad or failure about yourself  0 0 0 0 1  Trouble concentrating 0 2 1 0 1  Moving slowly or fidgety/restless 0 0 0 0 0  Suicidal thoughts 0 0 0 0 0  PHQ-9 Score 0 '9 11 1 15  ' Difficult doing work/chores Not difficult at all Somewhat difficult Somewhat difficult Not difficult at all Extremely dIfficult   Hypertension: BP Readings from Last 3 Encounters:  07/14/20 122/75  04/08/20 138/78  01/21/20 110/74   Obesity: Wt Readings from Last 3 Encounters:  07/14/20 210 lb (95.3 kg)  04/08/20 204 lb (92.5 kg)  01/21/20 205 lb 14.4 oz (93.4 kg)   BMI Readings from Last 3 Encounters:  07/14/20 37.20 kg/m  04/08/20 36.14 kg/m  01/21/20 36.47 kg/m     Vaccines:   Shingrix: 69-64 yo and ask insurance if covered when patient above 7 yo, patient does not qualify Pneumonia: educated and discussed with patient. Flu: educated and discussed with patient.  Hep C Screening: 11/2019 STD testing and prevention (HIV/chl/gon/syphilis): 11/2019 Intimate partner violence:*** Sexual History : Menstrual History/LMP/Abnormal Bleeding:  Incontinence Symptoms:   Breast cancer:  - Last Mammogram: None - BRCA gene screening: no family history  Osteoporosis: Discussed high calcium and vitamin D supplementation, weight bearing exercises  Cervical cancer  screening: 01/2020  Skin cancer: Discussed monitoring for atypical lesions  Colorectal cancer: no family history Lung cancer:  Low Dose CT Chest recommended if Age 67-80 years, 20 pack-year currently smoking OR have quit w/in 15years. Patient does not qualify.   ECG: 05/2012  Advanced Care Planning: A voluntary discussion about advance care planning including the explanation and discussion of advance directives.  Discussed health care proxy and Living will, and the patient was able to identify a health care proxy as ***.  Patient {DOES_DOES PYK:99833} have a living will at present time. If patient does have living will, I have requested they bring this to the clinic to be scanned in to their chart.  Lipids: Lab Results  Component Value Date   CHOL 149 11/23/2019   Lab Results  Component Value Date   HDL 46 (L) 11/23/2019   Lab Results  Component Value Date   LDLCALC 90 11/23/2019   Lab Results  Component Value Date   TRIG 50 11/23/2019   Lab Results  Component Value Date   CHOLHDL 3.2 11/23/2019   No results found for: LDLDIRECT  Glucose: Glucose  Date Value Ref Range Status  03/23/2012 87 65 - 99 mg/dL Final   Glucose, Bld  Date Value Ref Range Status  11/23/2019 80 65 - 99 mg/dL Final    Comment:    .  Fasting reference interval .   11/04/2017 97 65 - 99 mg/dL Final  11/27/2015 95 65 - 99 mg/dL Final    Patient Active Problem List   Diagnosis Date Noted   Class 2 obesity due to excess calories without serious comorbidity with body mass index (BMI) of 37.0 to 37.9 in adult 12/24/2019   Current moderate episode of major depressive disorder without prior episode ([place]) 11/23/2019   IUD (intrauterine device) in place 11/23/2019    Past Surgical History:  Procedure Laterality Date   NO PAST SURGERIES      Family History  Problem Relation Age of Onset   Diabetes Maternal Uncle     Social History   Socioeconomic History   Marital status: Single     Spouse name: Not on file   Number of children: 1   Years of education: Not on file   Highest education level: Not on file  Occupational History   Occupation: [employer]  Tobacco Use   Smoking status: Never   Smokeless tobacco: Never  Vaping Use   Vaping Use: Never used  Substance and Sexual Activity   Alcohol use: No   Drug use: No   Sexual activity: Yes    Partners: Male    Birth control/protection: I.U.D.    Comment: mirena  Other Topics Concern   Not on file  Social History Narrative   Not on file   Social Determinants of Health   Financial Resource Strain: Not on file  Food Insecurity: Not on file  Transportation Needs: Not on file  Physical Activity: Not on file  Stress: Not on file  Social Connections: Not on file  Intimate Partner Violence: Not on file     Current Outpatient Medications:    acetaminophen (TYLENOL) 500 MG tablet, Take 1 tablet (500 mg total) by mouth every 6 (six) hours as needed., Disp: 30 tablet, Rfl: 0   buPROPion (WELLBUTRIN XL) 150 MG 24 hr tablet, Take 1 tablet (150 mg total) by mouth daily., Disp: 90 tablet, Rfl: 1   escitalopram (LEXAPRO) 5 MG tablet, Take 1 tablet (5 mg total) by mouth daily. KEEP SCHEDULED APPOINTMENT FOR ADDITIONAL REFILLS, Disp: 30 tablet, Rfl: 0   levonorgestrel (MIRENA) 20 MCG/24HR IUD, 1 each by Intrauterine route once., Disp: , Rfl:   No Known Allergies   ROS  ***  Objective  There were no vitals filed for this visit.  There is no height or weight on file to calculate BMI.  Physical Exam ***  No results found for this or any previous visit (from the past 2160 hour(s)).  Diabetic Foot Exam: Diabetic Foot Exam - Simple   No data filed    ***  Fall Risk: Fall Risk  01/21/2020 12/24/2019 11/23/2019  Falls in the past year? 0 0 0  Number falls in past yr: 0 0 0  Injury with Fall? 0 0 0  Follow up - Falls evaluation completed Falls evaluation completed   ***  Functional Status Survey:    ***  Assessment & Plan  There are no diagnoses linked to this encounter.  -USPSTF grade A and B recommendations reviewed with patient; age-appropriate recommendations, preventive care, screening tests, etc discussed and encouraged; healthy living encouraged; see AVS for patient education given to patient -Discussed importance of 150 minutes of physical activity weekly, eat two servings of fish weekly, eat one serving of tree nuts ( cashews, pistachios, pecans, almonds.Marland Kitchen) every other day, eat 6 servings of fruit/vegetables daily and drink plenty of  water and avoid sweet beverages.   -Reviewed Health Maintenance: ***

## 2021-08-25 ENCOUNTER — Encounter: Payer: Medicaid Other | Admitting: Nurse Practitioner

## 2021-09-12 DIAGNOSIS — Z419 Encounter for procedure for purposes other than remedying health state, unspecified: Secondary | ICD-10-CM | POA: Diagnosis not present

## 2021-10-13 DIAGNOSIS — Z419 Encounter for procedure for purposes other than remedying health state, unspecified: Secondary | ICD-10-CM | POA: Diagnosis not present

## 2021-11-12 DIAGNOSIS — Z419 Encounter for procedure for purposes other than remedying health state, unspecified: Secondary | ICD-10-CM | POA: Diagnosis not present

## 2021-11-22 DIAGNOSIS — M546 Pain in thoracic spine: Secondary | ICD-10-CM | POA: Insufficient documentation

## 2021-11-22 DIAGNOSIS — J45909 Unspecified asthma, uncomplicated: Secondary | ICD-10-CM | POA: Insufficient documentation

## 2021-11-22 DIAGNOSIS — Y92410 Unspecified street and highway as the place of occurrence of the external cause: Secondary | ICD-10-CM | POA: Insufficient documentation

## 2021-11-22 DIAGNOSIS — Z041 Encounter for examination and observation following transport accident: Secondary | ICD-10-CM | POA: Diagnosis not present
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
## 2021-11-23 ENCOUNTER — Emergency Department
Admission: EM | Admit: 2021-11-23 | Discharge: 2021-11-23 | Disposition: A | Discharge status: Home / Self Care | Payer: Medicaid Other | Attending: Emergency Medicine | Admitting: Emergency Medicine

## 2021-11-23 MED ORDER — IBUPROFEN 400 MG PO TABS
400.0000 mg | ORAL_TABLET | Freq: Once | ORAL | Status: AC
  Administered 2021-11-23: 400 mg via ORAL
  Filled 2021-11-23: qty 1
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
## 2021-11-24 ENCOUNTER — Telehealth: Payer: Self-pay

## 2021-11-24 NOTE — Telephone Encounter (Signed)
Transition Care Management Unsuccessful Follow-up Telephone Call  Date of discharge and from where:  11/23/2021 from [facility]  Attempts:  1st Attempt  Reason for unsuccessful TCM follow-up call:  Unable to leave message

## 2021-11-25 NOTE — Telephone Encounter (Signed)
Transition Care Management Unsuccessful Follow-up Telephone Call  Date of discharge and from where:  11/23/2021 from [facility]  Attempts:  2nd Attempt  Reason for unsuccessful TCM follow-up call:  Unable to leave message

## 2021-11-26 NOTE — Telephone Encounter (Signed)
Transition Care Management Unsuccessful Follow-up Telephone Call ° °Date of discharge and from where:  11/23/2021 from [facility] ° °Attempts:  3rd Attempt ° °Reason for unsuccessful TCM follow-up call:  Unable to reach patient ° ° ° °

## 2021-12-04 ENCOUNTER — Ambulatory Visit: Payer: Medicaid Other | Admitting: Internal Medicine

## 2021-12-11 ENCOUNTER — Ambulatory Visit: Payer: Medicaid Other | Admitting: Internal Medicine

## 2021-12-11 ENCOUNTER — Encounter: Payer: Self-pay | Admitting: Internal Medicine

## 2021-12-11 VITALS — BP 130/78 | HR 80 | Temp 98.2°F | Resp 16 | Ht 63.0 in | Wt 219.3 lb

## 2021-12-11 DIAGNOSIS — R051 Acute cough: Secondary | ICD-10-CM | POA: Diagnosis not present

## 2021-12-11 DIAGNOSIS — N912 Amenorrhea, unspecified: Secondary | ICD-10-CM

## 2021-12-11 DIAGNOSIS — R0981 Nasal congestion: Secondary | ICD-10-CM

## 2021-12-11 DIAGNOSIS — J029 Acute pharyngitis, unspecified: Secondary | ICD-10-CM | POA: Diagnosis not present

## 2021-12-11 DIAGNOSIS — R110 Nausea: Secondary | ICD-10-CM

## 2021-12-11 LAB — POCT URINE PREGNANCY: Preg Test, Ur: NEGATIVE

## 2021-12-11 MED ORDER — FLUTICASONE PROPIONATE 50 MCG/ACT NA SUSP: 2.0000 | Freq: Every day | NASAL | 6 refills | Status: DC

## 2021-12-11 MED ORDER — BENZONATATE 100 MG PO CAPS: 100.0000 mg | ORAL_CAPSULE | Freq: Two times a day (BID) | ORAL | 0 refills | Status: DC | PRN

## 2021-12-11 MED ORDER — ONDANSETRON HCL 4 MG PO TABS: 4.0000 mg | ORAL_TABLET | Freq: Three times a day (TID) | ORAL | 0 refills | Status: DC | PRN

## 2021-12-11 NOTE — Progress Notes (Signed)
Acute Office Visit  Subjective:    Patient ID: Lauren Mercer, female    DOB: [birth year], 30 y.o.   MRN: [id number]  Chief Complaint  Patient presents with   Nausea    No periods has 12yr IUD, but has felt nauseous and took at home test and saw faint line    HPI Patient is in today for amenorrhea.  Has Mirena 20 mcg/25 hours IUD for 5 years.  No issues but hasn't had a period since after the birth of her child in 2018 after the IUD was placed. This is not abnormal for her but she was concerned recently because she's had some on and off nausea for the last 2 weeks and she is worried she could be pregnant. She took an at home test and thought there was a fine line and wanted to double check. She denies vomiting, abdominal pain, changes in bowels and fevers. Nausea is gradually getting better. She does have some new respiratory symptoms including sore throat, nasal congestion that started 2 days ago. She did take off work yesterday due to the symptoms.  No periods since 2018, nauseated for about 2 weeks, on and off throughout the day but no a certain time of time, no vomiting. Had a sore throat 2 days ago, was out from work yesterday. No COVID. Symptoms 12/28.   URI Compliant:  -Worst symptom: sore throat  -Fever: no -Cough: yes dry  -Shortness of breath: no -Wheezing: no -Chest pain: no -Chest tightness: no -Chest congestion: no -Nasal congestion: yes -Runny nose: yes -Post nasal drip: no -Sneezing: yes -Sore throat: yes -Sinus pressure: no -Headache: yes -Face pain: no -Ear pain: no    -Ear pressure: yes  bilateral -Vomiting: no -Sick contacts: yes, daughter sick last few days -Treatments attempted: none    Past Medical History:  Diagnosis Date   Amenorrhea 11/30/2016   Asthma    Medical history non-contributory     Past Surgical History:  Procedure Laterality Date   NO PAST SURGERIES      Family History  Problem Relation Age of Onset   Diabetes Maternal  Uncle     Social History   Socioeconomic History   Marital status: Single    Spouse name: Not on file   Number of children: 1   Years of education: Not on file   Highest education level: Not on file  Occupational History   Occupation: [employer]  Tobacco Use   Smoking status: Never   Smokeless tobacco: Never  Vaping Use   Vaping Use: Never used  Substance and Sexual Activity   Alcohol use: No   Drug use: No   Sexual activity: Yes    Partners: Male    Birth control/protection: I.U.D.    Comment: mirena  Other Topics Concern   Not on file  Social History Narrative   Not on file   Social Determinants of Health   Financial Resource Strain: Not on file  Food Insecurity: Not on file  Transportation Needs: Not on file  Physical Activity: Not on file  Stress: Not on file  Social Connections: Not on file  Intimate Partner Violence: Not on file    Outpatient Medications Prior to Visit  Medication Sig Dispense Refill   acetaminophen (TYLENOL) 500 MG tablet Take 1 tablet (500 mg total) by mouth every 6 (six) hours as needed. 30 tablet 0   buPROPion (WELLBUTRIN XL) 150 MG 24 hr tablet Take 1 tablet (150 mg total) by mouth  daily. 90 tablet 1   escitalopram (LEXAPRO) 5 MG tablet Take 1 tablet (5 mg total) by mouth daily. KEEP SCHEDULED APPOINTMENT FOR ADDITIONAL REFILLS 30 tablet 0   levonorgestrel (MIRENA) 20 MCG/24HR IUD 1 each by Intrauterine route once.     No facility-administered medications prior to visit.    No Known Allergies  Review of Systems  Constitutional:  Negative for chills and fever.  HENT:  Positive for congestion, postnasal drip, rhinorrhea and sore throat. Negative for ear discharge, ear pain, sinus pressure and sinus pain.   Respiratory:  Positive for cough. Negative for shortness of breath.   Cardiovascular:  Negative for chest pain.  Gastrointestinal:  Positive for nausea. Negative for abdominal pain, diarrhea and vomiting.  Neurological:  Positive  for headaches. Negative for dizziness.      Objective:    Physical Exam Constitutional:      Appearance: Normal appearance.  HENT:     Head: Normocephalic and atraumatic.     Right Ear: Tympanic membrane, ear canal and external ear normal.     Left Ear: Tympanic membrane, ear canal and external ear normal.     Nose: Nose normal.     Mouth/Throat:     Mouth: Mucous membranes are moist.     Pharynx: Oropharynx is clear.     Comments: PND present Eyes:     Conjunctiva/sclera: Conjunctivae normal.  Cardiovascular:     Rate and Rhythm: Normal rate and regular rhythm.  Pulmonary:     Effort: Pulmonary effort is normal.     Breath sounds: Normal breath sounds.  Musculoskeletal:     Right lower leg: No edema.     Left lower leg: No edema.  Skin:    General: Skin is warm and dry.  Neurological:     General: No focal deficit present.     Mental Status: She is alert. Mental status is at baseline.  Psychiatric:        Mood and Affect: Mood normal.        Behavior: Behavior normal.    BP 130/78    Pulse 80    Temp 98.2 F (36.8 C)    Resp 16    Ht 5\' 3"  (1.6 m)    Wt 219 lb 4.8 oz (99.5 kg)    SpO2 99%    BMI 38.85 kg/m  Wt Readings from Last 3 Encounters:  11/22/21 210 lb (95.3 kg)  07/14/20 210 lb (95.3 kg)  04/08/20 204 lb (92.5 kg)    Health Maintenance Due  Topic Date Due   COVID-19 Vaccine (1) Never done   Pneumococcal Vaccine 15-22 Years old (1 - PCV) Never done   INFLUENZA VACCINE  07/13/2021    There are no preventive care reminders to display for this patient.   Lab Results  Component Value Date   TSH 1.27 11/23/2019   Lab Results  Component Value Date   WBC 8.8 11/23/2019   HGB 12.7 11/23/2019   HCT 38.2 11/23/2019   MCV 88.2 11/23/2019   PLT 285 11/23/2019   Lab Results  Component Value Date   NA 141 11/23/2019   K 4.1 11/23/2019   CO2 21 11/23/2019   GLUCOSE 80 11/23/2019   BUN 12 11/23/2019   CREATININE 0.57 11/23/2019   BILITOT 0.4  11/23/2019   ALKPHOS 77 11/04/2017   AST 15 11/23/2019   ALT 16 11/23/2019   PROT 6.6 11/23/2019   ALBUMIN 3.8 11/04/2017   CALCIUM 9.1 11/23/2019  ANIONGAP 11 11/04/2017   Lab Results  Component Value Date   CHOL 149 11/23/2019   Lab Results  Component Value Date   HDL 46 (L) 11/23/2019   Lab Results  Component Value Date   LDLCALC 90 11/23/2019   Lab Results  Component Value Date   TRIG 50 11/23/2019   Lab Results  Component Value Date   CHOLHDL 3.2 11/23/2019   No results found for: HGBA1C     Assessment & Plan:   1. Amenorrhea: Pregnancy test negative.  - POCT urine pregnancy  2. Nausea/Sore throat/Acute cough/Nasal congestion: Due to nausea and new respiratory symptoms, she will be tested for COVID. For now treat symptomatically with Zofran as needed, cough suppressant and nasal steroid. Follow up as needed.  - POCT urine pregnancy - Novel Coronavirus, NAA ([facility]) - ondansetron (ZOFRAN) 4 MG tablet; Take 1 tablet (4 mg total) by mouth every 8 (eight) hours as needed for nausea or vomiting.  Dispense: 20 tablet; Refill: 0 - fluticasone (FLONASE) 50 MCG/ACT nasal spray; Place 2 sprays into both nostrils daily.  Dispense: 16 g; Refill: 6 - benzonatate (TESSALON) 100 MG capsule; Take 1 capsule (100 mg total) by mouth 2 (two) times daily as needed for cough.  Dispense: 20 capsule; Refill: 0   Margarita Mail, DO

## 2021-12-11 NOTE — Patient Instructions (Signed)
It was great seeing you today!  Plan discussed at today's visit: -Pregnancy negative -COVID test today, results in MyChart -Medications sent to take as needed for cough and nausea, nasal spray for congestion  Follow up in: as needed  Take care and let us know if you have any questions or concerns prior to your next visit.  Dr. Caralee Ates

## 2021-12-12 LAB — SARS-COV-2, NAA 2 DAY TAT

## 2021-12-12 LAB — NOVEL CORONAVIRUS, NAA: SARS-CoV-2, NAA: NOT DETECTED

## 2021-12-13 DIAGNOSIS — Z419 Encounter for procedure for purposes other than remedying health state, unspecified: Secondary | ICD-10-CM | POA: Diagnosis not present

## 2022-01-13 DIAGNOSIS — Z419 Encounter for procedure for purposes other than remedying health state, unspecified: Secondary | ICD-10-CM | POA: Diagnosis not present

## 2022-02-07 ENCOUNTER — Encounter: Payer: Self-pay | Admitting: Internal Medicine

## 2022-02-07 ENCOUNTER — Other Ambulatory Visit: Payer: Self-pay | Admitting: Family Medicine

## 2022-02-07 DIAGNOSIS — F321 Major depressive disorder, single episode, moderate: Secondary | ICD-10-CM

## 2022-02-08 ENCOUNTER — Other Ambulatory Visit: Payer: Self-pay

## 2022-02-08 DIAGNOSIS — F321 Major depressive disorder, single episode, moderate: Secondary | ICD-10-CM

## 2022-02-08 MED ORDER — ESCITALOPRAM OXALATE 5 MG PO TABS: 5.0000 mg | ORAL_TABLET | Freq: Every day | ORAL | 0 refills | Status: AC

## 2022-02-08 MED ORDER — BUPROPION HCL ER (XL) 150 MG PO TB24: 150.0000 mg | ORAL_TABLET | Freq: Every day | ORAL | 1 refills | Status: AC

## 2022-02-10 DIAGNOSIS — Z419 Encounter for procedure for purposes other than remedying health state, unspecified: Secondary | ICD-10-CM | POA: Diagnosis not present

## 2022-03-13 DIAGNOSIS — Z419 Encounter for procedure for purposes other than remedying health state, unspecified: Secondary | ICD-10-CM | POA: Diagnosis not present

## 2022-04-12 DIAGNOSIS — Z419 Encounter for procedure for purposes other than remedying health state, unspecified: Secondary | ICD-10-CM | POA: Diagnosis not present

## 2022-04-27 ENCOUNTER — Encounter: Payer: Self-pay | Admitting: Internal Medicine

## 2022-04-27 ENCOUNTER — Ambulatory Visit (INDEPENDENT_AMBULATORY_CARE_PROVIDER_SITE_OTHER): Payer: Medicaid Other | Admitting: Internal Medicine

## 2022-04-27 VITALS — BP 118/74 | HR 114 | Temp 98.1°F | Resp 16 | Ht 63.0 in | Wt 226.5 lb

## 2022-04-27 DIAGNOSIS — T8332XA Displacement of intrauterine contraceptive device, initial encounter: Secondary | ICD-10-CM

## 2022-04-27 NOTE — Patient Instructions (Signed)
Please call to schedule ultrasound at [phone]. ?

## 2022-04-27 NOTE — Progress Notes (Signed)
? ?  Acute Office Visit ? ?Subjective:  ? ?  ?Patient ID: Lauren Mercer, female    DOB: 03/27/1991, 31 y.o.   MRN: [id number] ? ?Chief Complaint  ?Patient presents with  ? Contraception  ?  Check for IUD string  ? ? ?HPI ?Patient is in today for concerns about IUD placement. She had a Mirena placed about 5.5 years ago. She is worried that the IUD is no longer there. She cannot feel the strings but hasn't been checking until recently. She did have some abdominal cramping a few weeks ago but did not noticed the IUD falling out, however she admits that she was not looking for it. She has not had any periods while having the IUD and denies any vaginal bleeding, change in discharge or pain. She does have one child who is 56 years old and is not interested in having more children at this time. She has a new partner who she is sexually active with and she want to make sure her IUD is still in place for contraception. She is using condoms as well. Last Pap 01/21/20 negative.  ? ?Review of Systems  ?Constitutional:  Negative for chills and fever.  ? ? ?   ?Objective:  ?  ?BP 118/74   Pulse (!) 114   Temp 98.1 ?F (36.7 ?C)   Resp 16   Ht 5\' 3"  (1.6 m)   Wt 226 lb 8 oz (102.7 kg)   SpO2 96%   BMI 40.12 kg/m?  ?BP Readings from Last 3 Encounters:  ?04/27/22 118/74  ?12/11/21 130/78  ?11/23/21 118/79  ? ?Wt Readings from Last 3 Encounters:  ?04/27/22 226 lb 8 oz (102.7 kg)  ?12/11/21 219 lb 4.8 oz (99.5 kg)  ?11/22/21 210 lb (95.3 kg)  ? ?  ? ?Physical Exam ?Constitutional:   ?   Appearance: Normal appearance.  ?HENT:  ?   Head: Normocephalic and atraumatic.  ?Eyes:  ?   Conjunctiva/sclera: Conjunctivae normal.  ?Genitourinary: ?   Comments: GYN:  External genitalia within normal limits.  Vaginal mucosa pink, moist, normal rugae.  Nonfriable cervix without lesions, no discharge or bleeding noted on speculum exam.  No IUD strings visualized.  ?Skin: ?   General: Skin is warm and dry.  ?Neurological:  ?   General: No focal  deficit present.  ?   Mental Status: She is alert. Mental status is at baseline.  ?Psychiatric:     ?   Mood and Affect: Mood normal.     ?   Behavior: Behavior normal.  ? ? ?No results found for any visits on 04/27/22. ? ? ?   ?Assessment & Plan:  ? ?1. Intrauterine contraceptive device threads lost, initial encounter: IUD strings could not be visualized on pelvic exam, pelvic ultrasound ordered to confirm IUD placement.  ? ?- US Pelvic Complete With Transvaginal; Future ? ?Return if symptoms worsen or fail to improve. ? ?Teodora Medici, DO ? ? ?

## 2022-05-04 ENCOUNTER — Ambulatory Visit
Admission: RE | Admit: 2022-05-04 | Discharge: 2022-05-04 | Disposition: A | Discharge status: Home / Self Care | Payer: Medicaid Other | Source: Ambulatory Visit | Attending: Internal Medicine | Admitting: Internal Medicine

## 2022-05-04 DIAGNOSIS — N83201 Unspecified ovarian cyst, right side: Secondary | ICD-10-CM | POA: Diagnosis not present

## 2022-05-04 DIAGNOSIS — T8332XA Displacement of intrauterine contraceptive device, initial encounter: Secondary | ICD-10-CM | POA: Insufficient documentation

## 2022-05-04 DIAGNOSIS — Z30431 Encounter for routine checking of intrauterine contraceptive device: Secondary | ICD-10-CM | POA: Diagnosis not present

## 2022-05-05 ENCOUNTER — Telehealth: Payer: Self-pay

## 2022-05-05 NOTE — Telephone Encounter (Signed)
Copied from [place] [id number]. Topic: General - Call Back - No Documentation >> May 05, 2022 10:25 AM Erick Blinks wrote: Reason for CRM: [id number] call back request to discuss her imaging results. Please advise

## 2022-05-05 NOTE — Telephone Encounter (Signed)
Pt notiified IUD in place

## 2022-05-13 DIAGNOSIS — Z419 Encounter for procedure for purposes other than remedying health state, unspecified: Secondary | ICD-10-CM | POA: Diagnosis not present

## 2022-06-12 DIAGNOSIS — Z419 Encounter for procedure for purposes other than remedying health state, unspecified: Secondary | ICD-10-CM | POA: Diagnosis not present

## 2022-07-13 DIAGNOSIS — Z419 Encounter for procedure for purposes other than remedying health state, unspecified: Secondary | ICD-10-CM | POA: Diagnosis not present

## 2022-10-08 ENCOUNTER — Encounter: Payer: Self-pay | Admitting: Emergency Medicine

## 2022-10-08 ENCOUNTER — Ambulatory Visit
Admission: EM | Admit: 2022-10-08 | Discharge: 2022-10-08 | Discharge status: Against Medical Advice | Payer: Medicaid Other

## 2022-10-08 ENCOUNTER — Ambulatory Visit
Admission: EM | Admit: 2022-10-08 | Discharge: 2022-10-08 | Disposition: A | Discharge status: Home / Self Care | Payer: Self-pay | Attending: Physician Assistant | Admitting: Physician Assistant

## 2022-10-08 DIAGNOSIS — J029 Acute pharyngitis, unspecified: Secondary | ICD-10-CM | POA: Insufficient documentation

## 2022-10-08 DIAGNOSIS — R051 Acute cough: Secondary | ICD-10-CM | POA: Insufficient documentation

## 2022-10-08 DIAGNOSIS — J039 Acute tonsillitis, unspecified: Secondary | ICD-10-CM | POA: Insufficient documentation

## 2022-10-08 DIAGNOSIS — R490 Dysphonia: Secondary | ICD-10-CM | POA: Insufficient documentation

## 2022-10-08 LAB — GROUP A STREP BY PCR: Group A Strep by PCR: NOT DETECTED

## 2022-10-08 MED ORDER — LIDOCAINE VISCOUS HCL 2 % MT SOLN: 15.0000 mL | OROMUCOSAL | 0 refills | Status: AC | PRN

## 2022-10-08 MED ORDER — PREDNISONE 20 MG PO TABS: 40.0000 mg | ORAL_TABLET | Freq: Every day | ORAL | 0 refills | Status: AC

## 2022-10-08 NOTE — ED Triage Notes (Signed)
Patient c/o cough, HAs, sore throat for a week.  Patient denies fevers.

## 2022-10-08 NOTE — Discharge Instructions (Signed)
-  Negative strep test.  You have a viral sore throat.  I have sent prednisone to help with swelling and inflammation as well as lidocaine help with pain.  He can also do Chloraseptic spray, throat lozenges, ibuprofen and Tylenol.  Should be feeling better in the next week or so. - Return if fever or worsening pain over the next week.

## 2022-10-08 NOTE — ED Provider Notes (Signed)
[facility]    CSN: [id number] Arrival date & time: 10/08/22  1747      History   Chief Complaint Chief Complaint  Patient presents with   Sore Throat   Cough    HPI Lauren Mercer is a 31 y.o. female presenting for a week history of sore throat, painful swallowing, voice hoarseness, and cough.  Denies fever.  Has been somewhat fatigued.  No sick contacts.  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  She has taken OTC meds for symptoms but reports throat pain is about 8-9 out of 10.  Symptoms are not getting any better or worse.  History of asthma but denies any breathing difficulty.  No other complaints.  HPI  Past Medical History:  Diagnosis Date   Amenorrhea 11/30/2016   Asthma    Medical history non-contributory     Patient Active Problem List   Diagnosis Date Noted   Class 2 obesity due to excess calories without serious comorbidity with body mass index (BMI) of 37.0 to 37.9 in adult 12/24/2019   Current moderate episode of major depressive disorder without prior episode ([place]) 11/23/2019   IUD (intrauterine device) in place 11/23/2019    Past Surgical History:  Procedure Laterality Date   NO PAST SURGERIES      OB History     Gravida  1   Para  1   Term  1   Preterm      AB      Living  1      SAB      IAB      Ectopic      Multiple  0   Live Births  1            Home Medications    Prior to Admission medications   Medication Sig Start Date End Date Taking? Authorizing Provider  levonorgestrel (MIRENA) 20 MCG/24HR IUD 1 each by Intrauterine route once.   Yes [provider]  lidocaine (XYLOCAINE) 2 % solution Use as directed 15 mLs in the mouth or throat every 3 (three) hours as needed for mouth pain (swish and spit). 10/08/22  Yes Danton Clap, PA-C  predniSONE (DELTASONE) 20 MG tablet Take 2 tablets (40 mg total) by mouth daily for 5 days. 10/08/22  10/13/22 Yes Danton Clap, PA-C  buPROPion (WELLBUTRIN XL) 150 MG 24 hr tablet Take 1 tablet (150 mg total) by mouth daily. 02/08/22   Teodora Medici, DO  escitalopram (LEXAPRO) 5 MG tablet Take 1 tablet (5 mg total) by mouth daily. KEEP SCHEDULED APPOINTMENT FOR ADDITIONAL REFILLS 02/08/22   Teodora Medici, DO    Family History Family History  Problem Relation Age of Onset   Diabetes Maternal Uncle     Social History Social History   Tobacco Use   Smoking status: Never   Smokeless tobacco: Never  Vaping Use   Vaping Use: Never used  Substance Use Topics   Alcohol use: No   Drug use: No     Allergies   Patient has no known allergies.   Review of Systems Review of Systems  Constitutional:  Positive for fatigue. Negative for chills, diaphoresis and fever.  HENT:  Positive for congestion and sore throat. Negative for ear pain, rhinorrhea, sinus pressure and sinus pain.   Respiratory:  Positive for cough. Negative for shortness of breath.   Gastrointestinal:  Negative for abdominal  pain, nausea and vomiting.  Musculoskeletal:  Negative for arthralgias and myalgias.  Skin:  Negative for rash.  Neurological:  Positive for headaches. Negative for weakness.  Hematological:  Negative for adenopathy.     Physical Exam Triage Vital Signs ED Triage Vitals  Enc Vitals Group     BP 10/08/22 1821 (!) 148/86     Pulse Rate 10/08/22 1821 (!) 105     Resp 10/08/22 1821 14     Temp 10/08/22 1821 98.7 F (37.1 C)     Temp Source 10/08/22 1821 Oral     SpO2 10/08/22 1821 95 %     Weight 10/08/22 1819 200 lb (90.7 kg)     Height 10/08/22 1819 5\' 3"  (1.6 m)     Head Circumference --      Peak Flow --      Pain Score 10/08/22 1818 8     Pain Loc --      Pain Edu? --      Excl. in [place]? --    No data found.  Updated Vital Signs BP (!) 148/86 (BP Location: Right Arm)   Pulse (!) 105   Temp 98.7 F (37.1 C) (Oral)   Resp 14   Ht 5\' 3"  (1.6 m)   Wt 200 lb (90.7 kg)    SpO2 95%   BMI 35.43 kg/m       Physical Exam Vitals and nursing note reviewed.  Constitutional:      General: She is not in acute distress.    Appearance: Normal appearance. She is well-developed. She is not ill-appearing or toxic-appearing.  HENT:     Head: Normocephalic and atraumatic.     Nose: Congestion present.     Mouth/Throat:     Mouth: Mucous membranes are moist.     Pharynx: Oropharynx is clear. Posterior oropharyngeal erythema present.     Tonsils: 1+ on the right. 1+ on the left.  Eyes:     General: No scleral icterus.       Right eye: No discharge.        Left eye: No discharge.     Conjunctiva/sclera: Conjunctivae normal.  Cardiovascular:     Rate and Rhythm: Normal rate and regular rhythm.     Heart sounds: Normal heart sounds.  Pulmonary:     Effort: Pulmonary effort is normal. No respiratory distress.     Breath sounds: Normal breath sounds.  Musculoskeletal:     Cervical back: Neck supple.  Skin:    General: Skin is dry.  Neurological:     General: No focal deficit present.     Mental Status: She is alert. Mental status is at baseline.     Motor: No weakness.     Gait: Gait normal.  Psychiatric:        Mood and Affect: Mood normal.        Behavior: Behavior normal.        Thought Content: Thought content normal.      UC Treatments / Results  Labs (all labs ordered are listed, but only abnormal results are displayed) Labs Reviewed  GROUP A STREP BY PCR    EKG   Radiology No results found.  Procedures Procedures (including critical care time)  Medications Ordered in UC Medications - No data to display  Initial Impression / Assessment and Plan / UC Course  I have reviewed the triage vital signs and the nursing notes.  Pertinent labs & imaging results that were available during  my care of the patient were reviewed by me and considered in my medical decision making (see chart for details).   31 year old female presents for sore  throat, painful swallowing, cough x1 week.  Patient is afebrile and overall well-appearing.  Her voice is worse.  On exam she does have nasal congestion, erythema posterior pharynx with 1+ bilateral enlarged tonsils.  Chest clear auscultation heart regular rate and rhythm.  PCR strep is negative.  We will treat for viral tonsillitis with prednisone and viscous lidocaine.  Supportive care.  Reviewed return precautions.  Work note given.   Final Clinical Impressions(s) / UC Diagnoses   Final diagnoses:  Acute tonsillitis, unspecified etiology  Sore throat  Voice hoarseness  Acute cough     Discharge Instructions      -Negative strep test.  You have a viral sore throat.  I have sent prednisone to help with swelling and inflammation as well as lidocaine help with pain.  He can also do Chloraseptic spray, throat lozenges, ibuprofen and Tylenol.  Should be feeling better in the next week or so. - Return if fever or worsening pain over the next week.     ED Prescriptions     Medication Sig Dispense Auth. Provider   predniSONE (DELTASONE) 20 MG tablet Take 2 tablets (40 mg total) by mouth daily for 5 days. 10 tablet Eusebio Friendly B, PA-C   lidocaine (XYLOCAINE) 2 % solution Use as directed 15 mLs in the mouth or throat every 3 (three) hours as needed for mouth pain (swish and spit). 100 mL Shirlee Latch, PA-C      PDMP not reviewed this encounter.   Shirlee Latch, PA-C 10/08/22 1906

## 2022-10-10 NOTE — Progress Notes (Deleted)
   Acute Office Visit  Subjective:     Patient ID: Lauren Mercer, female    DOB: 1991/11/03, 31 y.o.   MRN: [id number]  No chief complaint on file.   HPI Patient is in today for sore throat.   URI Compliant:   -Worst symptom: -Fever: {Blank single:19197::"yes","no"} -Cough: {Blank single:19197::"yes","no"} -Shortness of breath: {Blank single:19197::"yes","no"} -Wheezing: {Blank single:19197::"yes","no"} -Chest pain: {Blank single:19197::"yes","no","yes, with cough"} -Chest tightness: {Blank single:19197::"yes","no"} -Chest congestion: {Blank single:19197::"yes","no"} -Nasal congestion: {Blank single:19197::"yes","no"} -Runny nose: {Blank single:19197::"yes","no"} -Post nasal drip: {Blank single:19197::"yes","no"} -Sneezing: {Blank single:19197::"yes","no"} -Sore throat: {Blank single:19197::"yes","no"} -Swollen glands: {Blank single:19197::"yes","no"} -Sinus pressure: {Blank single:19197::"yes","no"} -Headache: {Blank single:19197::"yes","no"} -Face pain: {Blank single:19197::"yes","no"} -Toothache: {Blank single:19197::"yes","no"} -Ear pain: {Blank single:19197::"yes","no"} {Blank single:19197::""right","left", "bilateral"} -Ear pressure: {Blank single:19197::"yes","no"} {Blank single:19197::""right","left", "bilateral"} -Eyes red/itching:{Blank single:19197::"yes","no"} -Eye drainage/crusting: {Blank single:19197::"yes","no"}  -Vomiting: {Blank single:19197::"yes","no"} -Rash: {Blank single:19197::"yes","no"} -Fatigue: {Blank single:19197::"yes","no"} -Sick contacts: {Blank single:19197::"yes","no"} -Strep contacts: {Blank single:19197::"yes","no"}  -Context: {Blank multiple:19196::"better","worse","stable","fluctuating"} -Recurrent sinusitis: {Blank single:19197::"yes","no"} -Relief with OTC cold/cough medications: {Blank single:19197::"yes","no"}  -Treatments attempted: {Blank multiple:19196::"none","cold/sinus","mucinex","anti-histamine","pseudoephedrine","cough  syrup","antibiotics"}    ROS      Objective:    There were no vitals taken for this visit. {Vitals History (Optional):23777}  Physical Exam  No results found for any visits on 10/11/22.      Assessment & Plan:   Problem List Items Addressed This Visit   None   No orders of the defined types were placed in this encounter.   No follow-ups on file.  Teodora Medici, DO

## 2022-10-11 ENCOUNTER — Ambulatory Visit: Payer: Self-pay | Admitting: Internal Medicine

## 2022-10-25 ENCOUNTER — Telehealth: Payer: Self-pay

## 2022-10-25 NOTE — Telephone Encounter (Signed)
Called Lauren Mercer to follow up from where she called nurse triage after hour line on 10/23/22. Called and left a voicemail

## 2023-01-20 ENCOUNTER — Ambulatory Visit
Admission: EM | Admit: 2023-01-20 | Discharge: 2023-01-20 | Disposition: A | Discharge status: Home / Self Care | Payer: Self-pay | Attending: Urgent Care | Admitting: Urgent Care

## 2023-01-20 DIAGNOSIS — A084 Viral intestinal infection, unspecified: Secondary | ICD-10-CM

## 2023-01-20 LAB — POCT URINALYSIS DIP (MANUAL ENTRY)
Bilirubin, UA: NEGATIVE
Glucose, UA: NEGATIVE mg/dL
Ketones, POC UA: NEGATIVE mg/dL
Leukocytes, UA: NEGATIVE
Nitrite, UA: NEGATIVE
Protein Ur, POC: 30 mg/dL — AB
Spec Grav, UA: 1.025 (ref 1.010–1.025)
Urobilinogen, UA: 1 E.U./dL
pH, UA: 6.5 (ref 5.0–8.0)

## 2023-01-20 LAB — POCT URINE PREGNANCY: Preg Test, Ur: NEGATIVE

## 2023-01-20 MED ORDER — ONDANSETRON 4 MG PO TBDP: 4.0000 mg | ORAL_TABLET | Freq: Three times a day (TID) | ORAL | 0 refills | Status: DC | PRN

## 2023-01-20 NOTE — ED Provider Notes (Signed)
[facility]    CSN: [id number] Arrival date & time: 01/20/23  1024      History   Chief Complaint Chief Complaint  Patient presents with   Nausea   Headache   Abdominal Pain    HPI Lauren Mercer is a 32 y.o. female.    Headache Associated symptoms: abdominal pain   Abdominal Pain   Presents to urgent care with complaint of nausea, abdominal discomfort, headache.  Denies vomiting.  Symptoms starting yesterday.  Reports multiple family members with GI symptoms.  Also concern for possible pregnancy.  Past Medical History:  Diagnosis Date   Amenorrhea 11/30/2016   Asthma    Medical history non-contributory     Patient Active Problem List   Diagnosis Date Noted   Class 2 obesity due to excess calories without serious comorbidity with body mass index (BMI) of 37.0 to 37.9 in adult 12/24/2019   Current moderate episode of major depressive disorder without prior episode ([place]) 11/23/2019   IUD (intrauterine device) in place 11/23/2019    Past Surgical History:  Procedure Laterality Date   NO PAST SURGERIES      OB History     Gravida  1   Para  1   Term  1   Preterm      AB      Living  1      SAB      IAB      Ectopic      Multiple  0   Live Births  1            Home Medications    Prior to Admission medications   Medication Sig Start Date End Date Taking? Authorizing Provider  buPROPion (WELLBUTRIN XL) 150 MG 24 hr tablet Take 1 tablet (150 mg total) by mouth daily. 02/08/22   Teodora Medici, DO  escitalopram (LEXAPRO) 5 MG tablet Take 1 tablet (5 mg total) by mouth daily. KEEP SCHEDULED APPOINTMENT FOR ADDITIONAL REFILLS 02/08/22   Teodora Medici, DO  levonorgestrel [facility]) 20 MCG/24HR IUD 1 each by Intrauterine route once.    [provider]  lidocaine (XYLOCAINE) 2 % solution Use as directed 15 mLs in the mouth or throat every 3 (three) hours as needed for mouth pain (swish and spit). 10/08/22   Danton Clap, PA-C    Family History Family History  Problem Relation Age of Onset   Diabetes Maternal Uncle     Social History Social History   Tobacco Use   Smoking status: Never   Smokeless tobacco: Never  Vaping Use   Vaping Use: Never used  Substance Use Topics   Alcohol use: No   Drug use: No     Allergies   Patient has no known allergies.   Review of Systems Review of Systems  Gastrointestinal:  Positive for abdominal pain.  Neurological:  Positive for headaches.     Physical Exam Triage Vital Signs ED Triage Vitals [01/20/23 1102]  Enc Vitals Group     BP      Pulse      Resp      Temp      Temp src      SpO2      Weight      Height      Head Circumference      Peak Flow      Pain Score 10     Pain Loc      Pain Edu?  Excl. in [place]?    No data found.  Updated Vital Signs There were no vitals taken for this visit.  Visual Acuity Right Eye Distance:   Left Eye Distance:   Bilateral Distance:    Right Eye Near:   Left Eye Near:    Bilateral Near:     Physical Exam Vitals reviewed.  Constitutional:      Appearance: She is well-developed. She is ill-appearing.  Pulmonary:     Effort: Pulmonary effort is normal.     Breath sounds: Normal breath sounds.  Abdominal:     General: Bowel sounds are normal.     Palpations: Abdomen is soft.  Skin:    General: Skin is warm and dry.  Neurological:     Mental Status: She is alert and oriented to person, place, and time.  Psychiatric:        Mood and Affect: Mood normal.        Behavior: Behavior normal.      UC Treatments / Results  Labs (all labs ordered are listed, but only abnormal results are displayed) Labs Reviewed  POCT URINALYSIS DIP (MANUAL ENTRY) - Abnormal; Notable for the following components:      Result Value   Clarity, UA cloudy (*)    Blood, UA trace-intact (*)    Protein Ur, POC =30 (*)    All other components within normal limits  POCT URINE PREGNANCY     EKG   Radiology No results found.  Procedures Procedures (including critical care time)  Medications Ordered in UC Medications - No data to display  Initial Impression / Assessment and Plan / UC Course  I have reviewed the triage vital signs and the nursing notes.  Pertinent labs & imaging results that were available during my care of the patient were reviewed by me and considered in my medical decision making (see chart for details).   Patient is afebrile here without recent antipyretics. Satting well on room air. Overall is well appearing, well hydrated, without respiratory distress. Pulmonary exam is unremarkable.  Lungs CTAB without wheezing, rhonchi, rales.  Abdomen is soft and diffusely tender to palpation.  Bowel sounds are hyperactive.  Patient's symptoms are consistent with an acute viral process and recommending that she push hydration to avoid complications from dehydration.  Will prescribe Zofran to help control her nausea.  Final Clinical Impressions(s) / UC Diagnoses   Final diagnoses:  None   Discharge Instructions   None    ED Prescriptions   None    PDMP not reviewed this encounter.   Rose Phi, [place] 01/20/23 1124

## 2023-01-20 NOTE — ED Triage Notes (Addendum)
Patient to Urgent Care with complaints of nausea, abdominal discomfort, and headache. Denies any emesis. Symptoms started yesterday. Reports multiple family members are sick with GI symptoms.   Reports concern for possible pregnancy and requests a pregnancy test. Has 10 year IUD- has had some spotting this week.

## 2023-01-20 NOTE — Discharge Instructions (Addendum)
Follow up here or with your primary care provider if your symptoms are worsening or not improving.     

## 2023-02-03 IMAGING — US US PELVIS COMPLETE WITH TRANSVAGINAL
1 series · 13 of 25 positions shown · non-contrast
Comparison: None Available.

CLINICAL DATA: Assess IUD placement

EXAM:
TRANSABDOMINAL AND TRANSVAGINAL ULTRASOUND OF PELVIS
TECHNIQUE: Both transabdominal and transvaginal ultrasound examinations of the
pelvis were performed. Transabdominal technique was performed for
global imaging of the pelvis including uterus, ovaries, adnexal
regions, and pelvic cul-de-sac. It was necessary to proceed with
endovaginal exam following the transabdominal exam to visualize the
uterus endometrium ovaries.

[Series 1: us pelvis complete with transvaginal · 0.25mm/px · 103 acquisitions, 13 frames shown]
[im 1/103]
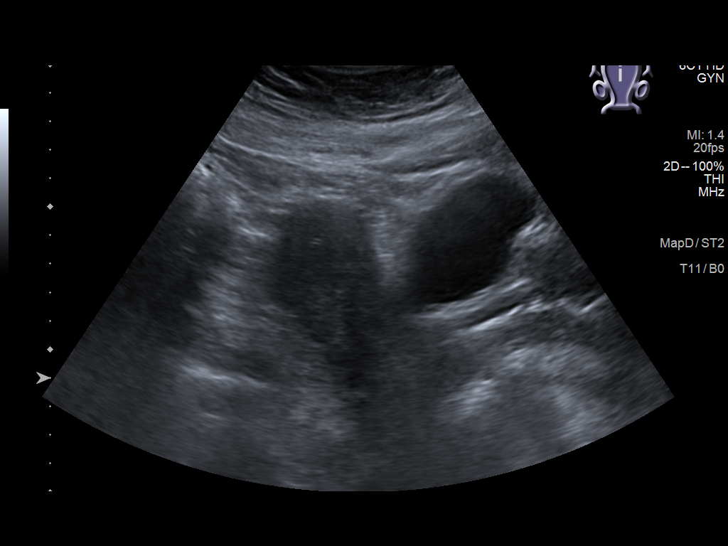
[im 9/103]
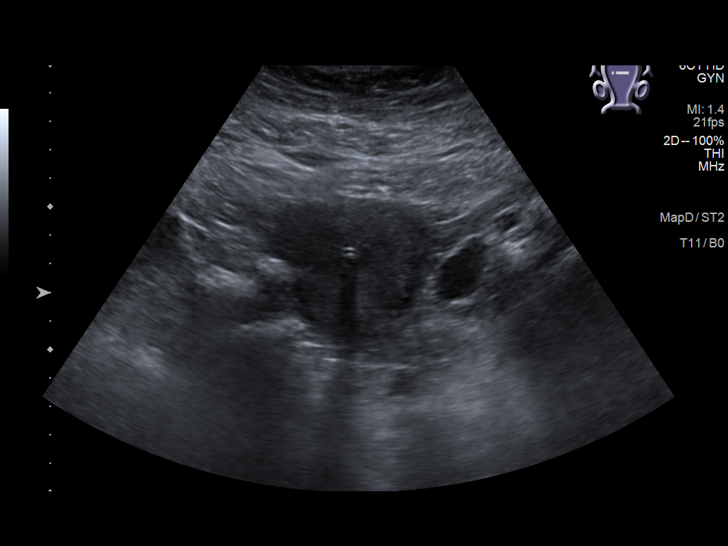
[im 18/103]
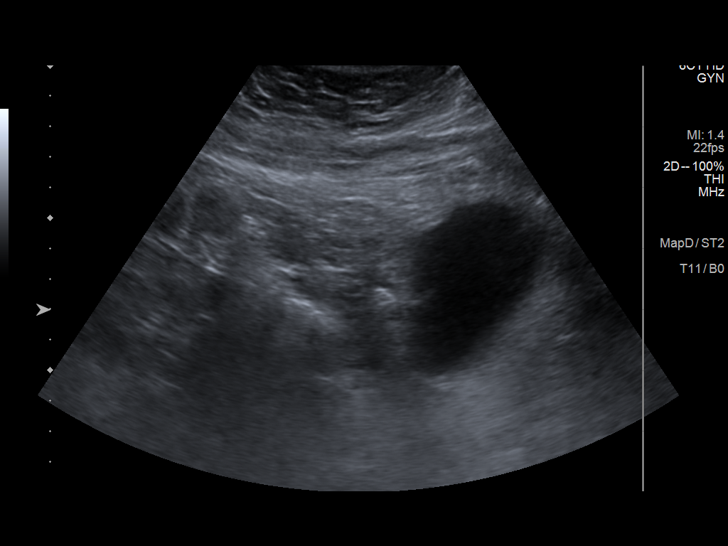
[im 26/103]
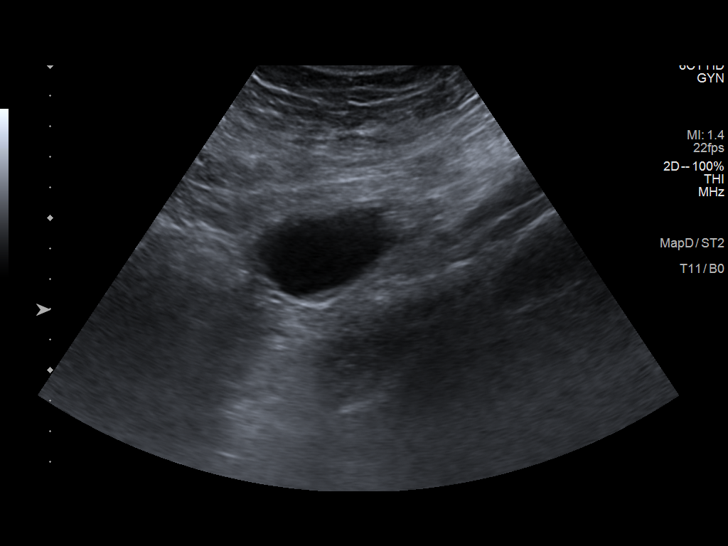
[im 35/103]
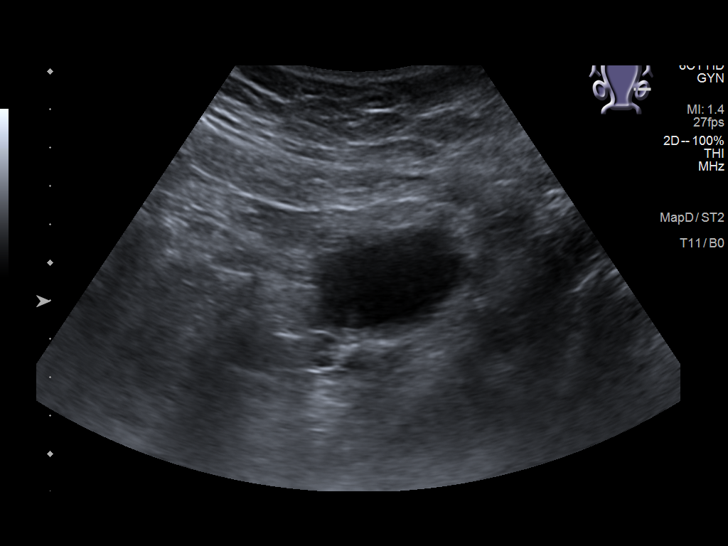
[im 43/103]
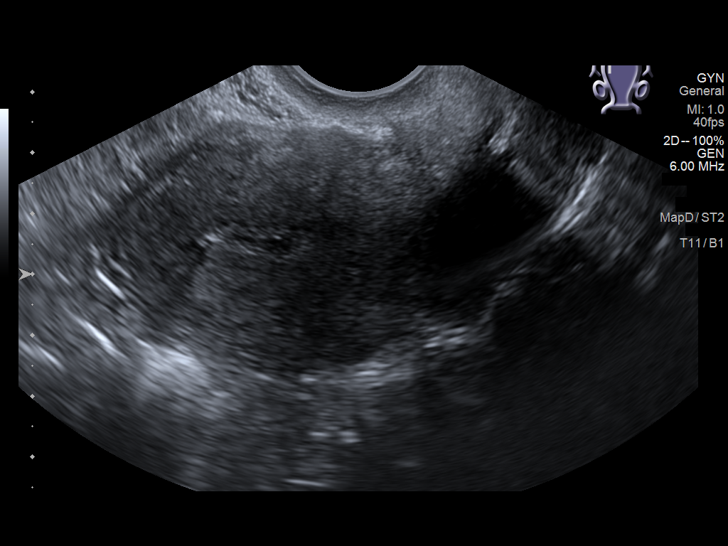
[im 52/103]
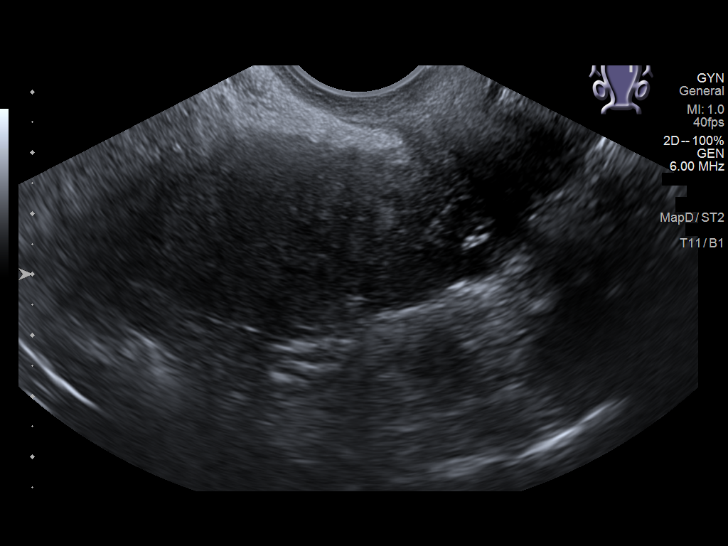
[im 60/103]
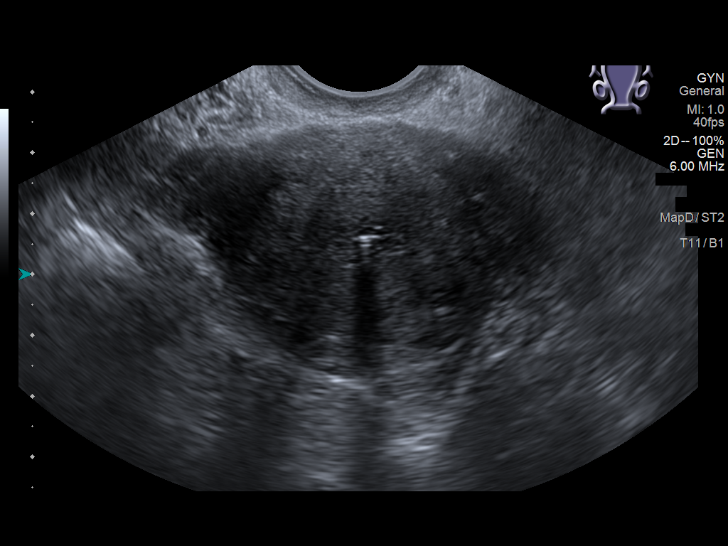
[im 69/103]
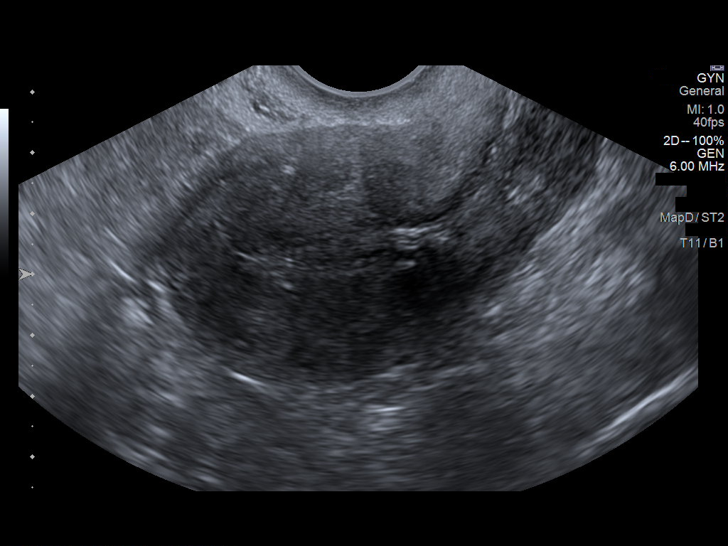
[im 77/103]
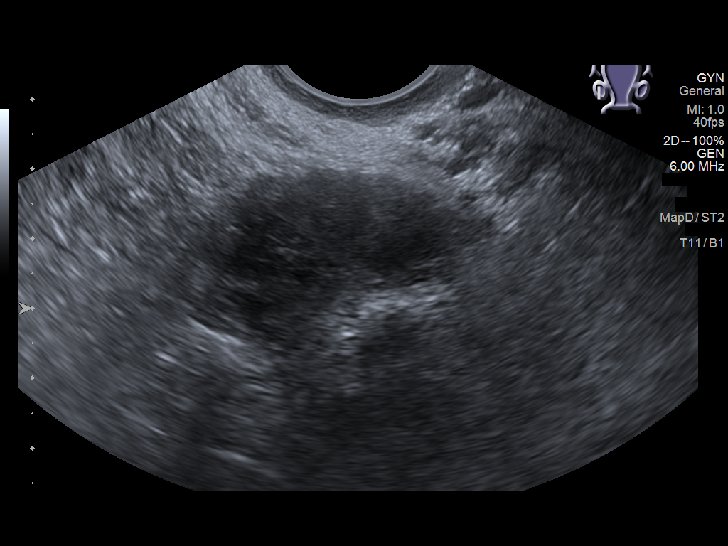
[im 86/103]
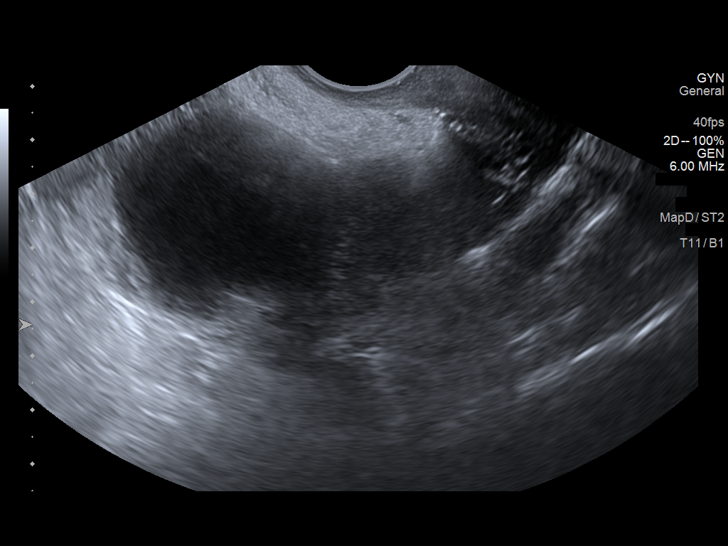
[im 94/103]
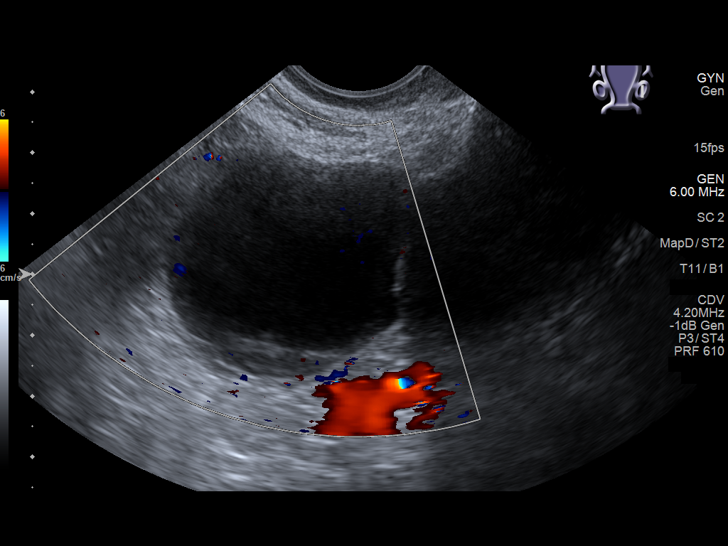
[im 103/103]
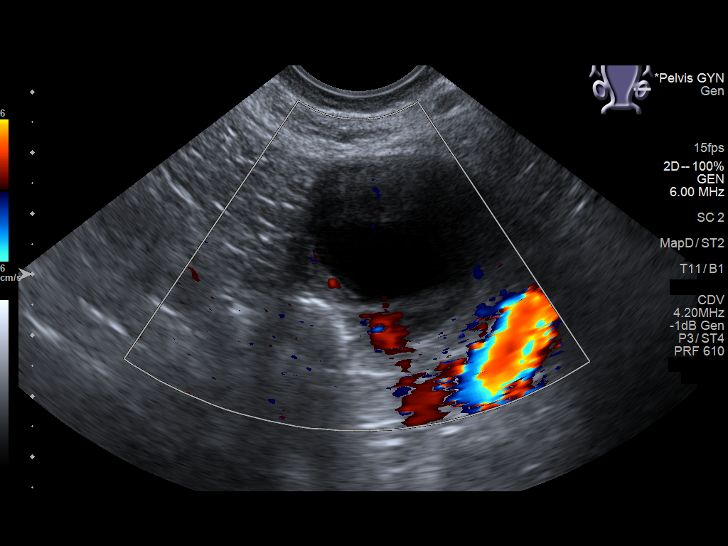

[13 of 25 positions shown; findings below may reference images not displayed]

FINDINGS: Uterus

Measurements: 8.4 x 4.1 x 5.4 cm = volume: 98 mL. No fibroids or
other mass visualized.

Endometrium

Thickness: 6.1 mm. Trace fluid in the endometrial canal. IUD
visualized within the uterine fundus and corpus

Right ovary

Measurements: 3.4 x 1.8 x 2.6 cm = volume: 8 mL. Normal
appearance/no adnexal mass.

Left ovary

Measurements: 4.9 x 3.9 x 3.1 cm = volume: 31 mL. Anechoic cyst
measuring 3.9 x 3.5 x 2.5 cm. No follow up imaging recommended.
Note: This recommendation does not apply to premenarchal patients or
to those with increased risk (genetic, family history, elevated
tumor markers or other high-risk factors) of ovarian cancer.
Reference: Radiology [DATE]):[id number].

Other findings

No abnormal free fluid.
IMPRESSION: IUD appears grossly appropriate in position by sonography. Otherwise
negative pelvic ultrasound.

## 2023-02-16 ENCOUNTER — Ambulatory Visit
Admission: EM | Admit: 2023-02-16 | Discharge: 2023-02-16 | Disposition: A | Discharge status: Home / Self Care | Payer: Self-pay | Attending: Urgent Care | Admitting: Urgent Care

## 2023-02-16 DIAGNOSIS — R6889 Other general symptoms and signs: Secondary | ICD-10-CM

## 2023-02-16 MED ORDER — OSELTAMIVIR PHOSPHATE 75 MG PO CAPS: 75.0000 mg | ORAL_CAPSULE | Freq: Two times a day (BID) | ORAL | 0 refills | Status: DC

## 2023-02-16 NOTE — Discharge Instructions (Addendum)
You have been diagnosed with a viral upper respiratory infection based on your symptoms and exam. Viral illnesses cannot be treated with antibiotics - they are self limiting - and you should find your symptoms resolving within a few days. Get plenty of rest and non-caffeinated fluids. Watch for signs of dehydration including reduced urine output and dark colored urine.  I have prescribed Tamiflu, antiviral therapy for influenza A, based on a presumptive diagnosis of influenza.     We recommend you use over-the-counter medications for symptom control including acetaminophen (Tylenol), ibuprofen (Advil/Motrin) or naproxen (Aleve) for throat pain, fever, chills or body aches. You may combine use of acetaminophen and ibuprofen/naproxen if needed.  Some patients find an pain-relieving throat spray such as Chloraseptic to be effective.  Also recommend cold/cough medication containing a cough suppressant such as dextromethorphan, as needed.   Saline mist spray is helpful for removing excess mucus from your nose.  Room humidifiers are helpful to ease breathing at night. I recommend guaifenesin (Mucinex) with plenty of water throughout the day to help thin and loosen mucus secretions in your respiratory passages.   If appropriate based upon your other medical problems, you might also find relief of nasal/sinus congestion symptoms by using a nasal decongestant such as fluticasone (Flonase ) or pseudoephedrine (Sudafed sinus).  You will need to obtain Sudafed from behind the pharmacist counter.  Speak to the pharmacist to verify that you are not duplicating medications with other over-the-counter formulations that you may be using.

## 2023-02-16 NOTE — ED Provider Notes (Signed)
Lauren Mercer    CSN: [id number] Arrival date & time: 02/16/23  1607      History   Chief Complaint Chief Complaint  Patient presents with   Cough   Generalized Body Aches    HPI Lauren Mercer is a 32 y.o. female.    Cough   She presents to urgent care with symptoms since 2 days including vomiting, cough, loss of voice, sore throat, body aches, chills, ear pain, fatigue.  She endorses contact with influenza positive individual.  Past Medical History:  Diagnosis Date   Amenorrhea 11/30/2016   Asthma    Medical history non-contributory     Patient Active Problem List   Diagnosis Date Noted   Class 2 obesity due to excess calories without serious comorbidity with body mass index (BMI) of 37.0 to 37.9 in adult 12/24/2019   Current moderate episode of major depressive disorder without prior episode ([place]) 11/23/2019   IUD (intrauterine device) in place 11/23/2019    Past Surgical History:  Procedure Laterality Date   NO PAST SURGERIES      OB History     Gravida  1   Para  1   Term  1   Preterm      AB      Living  1      SAB      IAB      Ectopic      Multiple  0   Live Births  1            Home Medications    Prior to Admission medications   Medication Sig Start Date End Date Taking? Authorizing Provider  buPROPion (WELLBUTRIN XL) 150 MG 24 hr tablet Take 1 tablet (150 mg total) by mouth daily. 02/08/22  Yes Teodora Medici, DO  escitalopram (LEXAPRO) 5 MG tablet Take 1 tablet (5 mg total) by mouth daily. KEEP SCHEDULED APPOINTMENT FOR ADDITIONAL REFILLS 02/08/22  Yes Teodora Medici, DO  levonorgestrel [facility]) 20 MCG/24HR IUD 1 each by Intrauterine route once.    [provider]  lidocaine (XYLOCAINE) 2 % solution Use as directed 15 mLs in the mouth or throat every 3 (three) hours as needed for mouth pain (swish and spit). 10/08/22   Laurene Footman B, PA-C  ondansetron (ZOFRAN-ODT) 4 MG disintegrating tablet  Take 1 tablet (4 mg total) by mouth every 8 (eight) hours as needed for nausea or vomiting. 01/20/23   Isaah Furry, Annie Main, FNP    Family History Family History  Problem Relation Age of Onset   Diabetes Maternal Uncle     Social History Social History   Tobacco Use   Smoking status: Never   Smokeless tobacco: Never  Vaping Use   Vaping Use: Never used  Substance Use Topics   Alcohol use: No   Drug use: No     Allergies   Patient has no known allergies.   Review of Systems Review of Systems  Respiratory:  Positive for cough.      Physical Exam Triage Vital Signs ED Triage Vitals  Enc Vitals Group     BP 02/16/23 1824 106/69     Pulse Rate 02/16/23 1824 91     Resp 02/16/23 1824 16     Temp 02/16/23 1824 99.8 F (37.7 C)     Temp Source 02/16/23 1824 Oral     SpO2 02/16/23 1824 95 %     Weight --      Height --  Head Circumference --      Peak Flow --      Pain Score 02/16/23 1825 7     Pain Loc --      Pain Edu? --      Excl. in [place]? --    No data found.  Updated Vital Signs BP 106/69 (BP Location: Right Arm)   Pulse 91   Temp 99.8 F (37.7 C) (Oral)   Resp 16   LMP  (LMP Unknown)   SpO2 95%   Visual Acuity Right Eye Distance:   Left Eye Distance:   Bilateral Distance:    Right Eye Near:   Left Eye Near:    Bilateral Near:     Physical Exam Vitals reviewed.  Constitutional:      Appearance: Normal appearance. She is ill-appearing.  HENT:     Mouth/Throat:     Pharynx: No oropharyngeal exudate or posterior oropharyngeal erythema.  Cardiovascular:     Rate and Rhythm: Normal rate and regular rhythm.     Pulses: Normal pulses.     Heart sounds: Normal heart sounds.  Pulmonary:     Effort: Pulmonary effort is normal.     Breath sounds: Normal breath sounds.  Skin:    General: Skin is warm and dry.  Neurological:     General: No focal deficit present.     Mental Status: She is alert and oriented to person, place, and time.   Psychiatric:        Mood and Affect: Mood normal.        Behavior: Behavior normal.      UC Treatments / Results  Labs (all labs ordered are listed, but only abnormal results are displayed) Labs Reviewed - No data to display  EKG   Radiology No results found.  Procedures Procedures (including critical care time)  Medications Ordered in UC Medications - No data to display  Initial Impression / Assessment and Plan / UC Course  I have reviewed the triage vital signs and the nursing notes.  Pertinent labs & imaging results that were available during my care of the patient were reviewed by me and considered in my medical decision making (see chart for details).   Patient is afebrile here without recent antipyretics. Satting well on room air. Overall is ill appearing, well hydrated, without respiratory distress. Pulmonary exam is unremarkable.  Lungs CTAB without wheezing, rhonchi, rales.  Pharyngeal erythema or peritonsillar exudates.  Patient's symptoms are consistent with an acute viral process including flu.  Given her direct contact with flu positive individual, will treat presumptively with Tamiflu.  Otherwise recommending use of OTC medication for symptom control.    Final Clinical Impressions(s) / UC Diagnoses   Final diagnoses:  None   Discharge Instructions   None    ED Prescriptions   None    PDMP not reviewed this encounter.   Rose Phi, [place] 02/16/23 [phone]

## 2023-02-16 NOTE — ED Triage Notes (Signed)
Monday pt states she was having some vomiting, cough, loss of voice, sore throat, body aches, chills, ear pain, fatigue. Pt states she was around a friend that had the flu recently. Taking dyquil.

## 2023-03-02 ENCOUNTER — Ambulatory Visit: Payer: Self-pay | Admitting: *Deleted

## 2023-03-02 NOTE — Telephone Encounter (Signed)
  Chief Complaint: Vaginal Spotting Symptoms: Spotting, not daily. "Bleeding after intercourse pretty bad. Last about 30 minutes the just stops." Frequency: 1 month Pertinent Negatives: Patient denies dizziness, abdominal pain Disposition: [] ED /[] Urgent Care (no appt availability in office) / [x] Appointment(In office/virtual)/ []  [facility]/ [] Home Care/ [] Refused Recommended Disposition /[] [facility]/ []  Follow-up with PCP Additional Notes: Pt has appt tomorrow secured earlier for cough. Care advise provided, pt verbalizes understanding. Reason for Disposition  Bleeding or spotting occurs after sex (Exception: First intercourse.)  Answer Assessment - Initial Assessment Questions 1. AMOUNT: "Describe the bleeding that you are having."    - SPOTTING: spotting, or pinkish / brownish mucous discharge; does not fill panty liner or pad    - MILD:  less than 1 pad / hour; less than patient's usual menstrual bleeding   - MODERATE: 1-2 pads / hour; 1 menstrual cup every 6 hours; small-medium blood clots (e.g., pea, grape, small coin)   - SEVERE: soaking 2 or more pads/hour for 2 or more hours; 1 menstrual cup every 2 hours; bleeding not contained by pads or continuous red blood from vagina; large blood clots (e.g., golf ball, large coin)      Spotting "When stressed" 2. ONSET: "When did the bleeding begin?" "Is it continuing now?"     1 month after intercourse 5. ABDOMEN PAIN: "Do you have any pain?" "How bad is the pain?"  (e.g., Scale 1-10; mild, moderate, or severe)   - MILD (1-3): doesn't interfere with normal activities, abdomen soft and not tender to touch    - MODERATE (4-7): interferes with normal activities or awakens from sleep, abdomen tender to touch    - SEVERE (8-10): excruciating pain, doubled over, unable to do any normal activities      Mild cramps at times 6. PREGNANCY: "Is there any chance you are pregnant?" "When was your last menstrual period?"      no 7. BREASTFEEDING: "Are you breastfeeding?"     no 8. HORMONE MEDICINES: "Are you taking any hormone medicines, prescription or over-the-counter?" (e.g., birth control pills, estrogen)     no 9. BLOOD THINNER MEDICINES: "Do you take any blood thinners?" (e.g., Coumadin / warfarin, Pradaxa / dabigatran, aspirin)     no 10. CAUSE: "What do you think is causing the bleeding?" (e.g., recent gyn surgery, recent gyn procedure; known bleeding disorder, cervical cancer, polycystic ovarian disease, fibroids)         IUD maybe 11. HEMODYNAMIC STATUS: "Are you weak or feeling lightheaded?" If Yes, ask: "Can you stand and walk normally?"        no 12. OTHER SYMPTOMS: "What other symptoms are you having with the bleeding?" (e.g., passed tissue, vaginal discharge, fever, menstrual-type cramps)       Bleeding after intercourse, brownish, "A lot of blood for 1 hour then just stopped." has IUD  Protocols used: Vaginal Bleeding - Abnormal-A-AH

## 2023-03-03 ENCOUNTER — Ambulatory Visit: Payer: Self-pay | Admitting: Physician Assistant

## 2023-05-13 ENCOUNTER — Ambulatory Visit
Admission: EM | Admit: 2023-05-13 | Discharge: 2023-05-13 | Disposition: A | Discharge status: Home / Self Care | Payer: Self-pay | Attending: Emergency Medicine | Admitting: Emergency Medicine

## 2023-05-13 DIAGNOSIS — S1086XA Insect bite of other specified part of neck, initial encounter: Secondary | ICD-10-CM

## 2023-05-13 DIAGNOSIS — L989 Disorder of the skin and subcutaneous tissue, unspecified: Secondary | ICD-10-CM

## 2023-05-13 DIAGNOSIS — W57XXXA Bitten or stung by nonvenomous insect and other nonvenomous arthropods, initial encounter: Secondary | ICD-10-CM

## 2023-05-13 MED ORDER — MUPIROCIN 2 % EX OINT: 1.0000 | TOPICAL_OINTMENT | Freq: Two times a day (BID) | CUTANEOUS | 0 refills | Status: AC

## 2023-05-13 NOTE — Discharge Instructions (Addendum)
Apply the antibiotic ointment as directed.  Follow up with your primary care provider if your symptoms are not improving.     

## 2023-05-13 NOTE — ED Triage Notes (Signed)
Patient to Urgent Care with complaints of bump on her neck that appeared two days. Reports she had a large white head, was unable to pop or drain the area. Reports concerns about a possible tick/ insect bite.  Also reports some diarrhea.

## 2023-05-13 NOTE — ED Provider Notes (Signed)
Renaldo Fiddler    CSN: [id number] Arrival date & time: 05/13/23  1504      History   Chief Complaint Chief Complaint  Patient presents with   Rash    HPI Lauren Mercer is a 32 y.o. female.  Patient presents with a "bump" on the left side of her neck x 2 days.  The bump had a white head that she tried to "pop" without success.  No redness or drainage.  No fever, chills, rash, or other symptoms.    The history is provided by the patient and medical records.    Past Medical History:  Diagnosis Date   Amenorrhea 11/30/2016   Asthma    Medical history non-contributory     Patient Active Problem List   Diagnosis Date Noted   Class 2 obesity due to excess calories without serious comorbidity with body mass index (BMI) of 37.0 to 37.9 in adult 12/24/2019   Current moderate episode of major depressive disorder without prior episode (HCC) 11/23/2019   IUD (intrauterine device) in place 11/23/2019    Past Surgical History:  Procedure Laterality Date   NO PAST SURGERIES      OB History     Gravida  1   Para  1   Term  1   Preterm      AB      Living  1      SAB      IAB      Ectopic      Multiple  0   Live Births  1            Home Medications    Prior to Admission medications   Medication Sig Start Date End Date Taking? Authorizing Provider  mupirocin ointment (BACTROBAN) 2 % Apply 1 Application topically 2 (two) times daily. 05/13/23  Yes Mickie Bail, NP  buPROPion (WELLBUTRIN XL) 150 MG 24 hr tablet Take 1 tablet (150 mg total) by mouth daily. 02/08/22   Margarita Mail, DO  escitalopram (LEXAPRO) 5 MG tablet Take 1 tablet (5 mg total) by mouth daily. KEEP SCHEDULED APPOINTMENT FOR ADDITIONAL REFILLS 02/08/22   Margarita Mail, DO  levonorgestrel [facility]) 20 MCG/24HR IUD 1 each by Intrauterine route once.    [provider]  lidocaine (XYLOCAINE) 2 % solution Use as directed 15 mLs in the mouth or throat every 3 (three)  hours as needed for mouth pain (swish and spit). 10/08/22   Eusebio Friendly B, PA-C  ondansetron (ZOFRAN-ODT) 4 MG disintegrating tablet Take 1 tablet (4 mg total) by mouth every 8 (eight) hours as needed for nausea or vomiting. 01/20/23   Immordino, Jeannett Senior, FNP  oseltamivir (TAMIFLU) 75 MG capsule Take 1 capsule (75 mg total) by mouth every 12 (twelve) hours. 02/16/23   Immordino, Jeannett Senior, FNP    Family History Family History  Problem Relation Age of Onset   Diabetes Maternal Uncle     Social History Social History   Tobacco Use   Smoking status: Never   Smokeless tobacco: Never  Vaping Use   Vaping Use: Never used  Substance Use Topics   Alcohol use: No   Drug use: No     Allergies   Patient has no known allergies.   Review of Systems Review of Systems  Constitutional:  Negative for chills and fever.  HENT:  Negative for ear pain and sore throat.   Respiratory:  Negative for cough and shortness of breath.   Cardiovascular:  Negative  for chest pain and palpitations.  Musculoskeletal:  Negative for neck pain and neck stiffness.  Skin:  Positive for wound. Negative for color change and rash.  All other systems reviewed and are negative.    Physical Exam Triage Vital Signs ED Triage Vitals  Enc Vitals Group     BP 05/13/23 1515 122/80     Pulse Rate 05/13/23 1515 85     Resp 05/13/23 1515 18     Temp 05/13/23 1515 97.6 F (36.4 C)     Temp src --      SpO2 05/13/23 1515 97 %     Weight --      Height --      Head Circumference --      Peak Flow --      Pain Score 05/13/23 1523 6     Pain Loc --      Pain Edu? --      Excl. in GC? --    No data found.  Updated Vital Signs BP 122/80   Pulse 85   Temp 97.6 F (36.4 C)   Resp 18   SpO2 97%   Visual Acuity Right Eye Distance:   Left Eye Distance:   Bilateral Distance:    Right Eye Near:   Left Eye Near:    Bilateral Near:     Physical Exam Vitals and nursing note reviewed.  Constitutional:       General: She is not in acute distress.    Appearance: Normal appearance. She is well-developed. She is not ill-appearing.  HENT:     Mouth/Throat:     Mouth: Mucous membranes are moist.  Cardiovascular:     Rate and Rhythm: Normal rate and regular rhythm.  Pulmonary:     Effort: Pulmonary effort is normal. No respiratory distress.  Musculoskeletal:        General: No swelling, tenderness, deformity or signs of injury. Normal range of motion.     Cervical back: Neck supple.  Skin:    General: Skin is warm and dry.     Capillary Refill: Capillary refill takes less than 2 seconds.     Findings: Lesion present. No erythema.     Comments: 0.5 cm area of induration with central scabbed lesion.  No erythema or drainage.    Neurological:     General: No focal deficit present.     Mental Status: She is alert and oriented to person, place, and time.     Sensory: No sensory deficit.     Motor: No weakness.  Psychiatric:        Mood and Affect: Mood normal.        Behavior: Behavior normal.      UC Treatments / Results  Labs (all labs ordered are listed, but only abnormal results are displayed) Labs Reviewed - No data to display  EKG   Radiology No results found.  Procedures Procedures (including critical care time)  Medications Ordered in UC Medications - No data to display  Initial Impression / Assessment and Plan / UC Course  I have reviewed the triage vital signs and the nursing notes.  Pertinent labs & imaging results that were available during my care of the patient were reviewed by me and considered in my medical decision making (see chart for details).    Skin lesion of neck due to insect bite.  Work note provided per patient request.  With mupirocin ointment.  Wound care instructions and signs of infection discussed.  Education provided on insect bite.  Instructed patient to follow up with her PCP if her symptoms are not improving.  She agrees to plan of care.     Final Clinical Impressions(s) / UC Diagnoses   Final diagnoses:  Skin lesion of neck  Insect bite of other part of neck, initial encounter     Discharge Instructions      Apply the antibiotic ointment as directed.  Follow up with your primary care provider if your symptoms are not improving.        ED Prescriptions     Medication Sig Dispense Auth. Provider   mupirocin ointment (BACTROBAN) 2 % Apply 1 Application topically 2 (two) times daily. 22 g Mickie Bail, NP      PDMP not reviewed this encounter.   Mickie Bail, NP 05/13/23 [phone]

## 2023-12-16 ENCOUNTER — Encounter: Payer: Self-pay | Admitting: *Deleted

## 2023-12-16 ENCOUNTER — Ambulatory Visit
Admission: EM | Admit: 2023-12-16 | Discharge: 2023-12-16 | Disposition: A | Discharge status: Home / Self Care | Payer: Self-pay | Attending: Emergency Medicine | Admitting: Emergency Medicine

## 2023-12-16 ENCOUNTER — Other Ambulatory Visit: Payer: Self-pay

## 2023-12-16 DIAGNOSIS — J069 Acute upper respiratory infection, unspecified: Secondary | ICD-10-CM

## 2023-12-16 LAB — POC COVID19/FLU A&B COMBO
Covid Antigen, POC: NEGATIVE
Influenza A Antigen, POC: NEGATIVE
Influenza B Antigen, POC: NEGATIVE

## 2023-12-16 NOTE — Discharge Instructions (Signed)
 Your symptoms today are most likely being caused by a virus and should steadily improve in time it can take up to 7 to 10 days before you truly start to see a turnaround however things will get better  Covid and flu test negative     You can take Tylenol and/or Ibuprofen as needed for fever reduction and pain relief.   For cough: honey 1/2 to 1 teaspoon (you can dilute the honey in water or another fluid).  You can also use guaifenesin and dextromethorphan for cough. You can use a humidifier for chest congestion and cough.  If you don't have a humidifier, you can sit in the bathroom with the hot shower running.      For sore throat: try warm salt water gargles, cepacol lozenges, throat spray, warm tea or water with lemon/honey, popsicles or ice, or OTC cold relief medicine for throat discomfort.   For congestion: take a daily anti-histamine like Zyrtec, Claritin, and a oral decongestant, such as pseudoephedrine.  You can also use Flonase 1-2 sprays in each nostril daily.   It is important to stay hydrated: drink plenty of fluids (water, gatorade/powerade/pedialyte, juices, or teas) to keep your throat moisturized and help further relieve irritation/discomfort.

## 2023-12-16 NOTE — ED Provider Notes (Signed)
 CAY RALPH PELT    CSN: [id number] Arrival date & time: 12/16/23  1050      History   Chief Complaint Chief Complaint  Patient presents with   Sore Throat   Ear Fullness    HPI Lauren Mercer is a 33 y.o. female.   Patient presents for evaluation of nasal congestion, rhinorrhea and bilateral ear fullness beginning 2 to 3 days ago, began to experience intermittent generalized headaches, chills, subjective fever and a sore throat this morning upon awakening.  Known sick contact.Tolerating food and liquids but appetite is decreased.  Has not attempted treatment of symptoms.   Past Medical History:  Diagnosis Date   Amenorrhea 11/30/2016   Asthma    Medical history non-contributory     Patient Active Problem List   Diagnosis Date Noted   Class 2 obesity due to excess calories without serious comorbidity with body mass index (BMI) of 37.0 to 37.9 in adult 12/24/2019   Current moderate episode of major depressive disorder without prior episode (HCC) 11/23/2019   IUD (intrauterine device) in place 11/23/2019    Past Surgical History:  Procedure Laterality Date   NO PAST SURGERIES      OB History     Gravida  1   Para  1   Term  1   Preterm      AB      Living  1      SAB      IAB      Ectopic      Multiple  0   Live Births  1            Home Medications    Prior to Admission medications   Medication Sig Start Date End Date Taking? Authorizing Provider  levonorgestrel  (MIRENA ) 20 MCG/24HR IUD 1 each by Intrauterine route once.   Yes [provider]  buPROPion  (WELLBUTRIN  XL) 150 MG 24 hr tablet Take 1 tablet (150 mg total) by mouth daily. 02/08/22   Bernardo Fend, DO  escitalopram  (LEXAPRO ) 5 MG tablet Take 1 tablet (5 mg total) by mouth daily. KEEP SCHEDULED APPOINTMENT FOR ADDITIONAL REFILLS 02/08/22   Bernardo Fend, DO  lidocaine  (XYLOCAINE ) 2 % solution Use as directed 15 mLs in the mouth or throat every 3 (three)  hours as needed for mouth pain (swish and spit). 10/08/22   Arvis Huxley B, PA-C  mupirocin  ointment (BACTROBAN ) 2 % Apply 1 Application topically 2 (two) times daily. 05/13/23   Corlis Burnard DEL, NP  ondansetron  (ZOFRAN -ODT) 4 MG disintegrating tablet Take 1 tablet (4 mg total) by mouth every 8 (eight) hours as needed for nausea or vomiting. 01/20/23   Immordino, Garnette, FNP  oseltamivir  (TAMIFLU ) 75 MG capsule Take 1 capsule (75 mg total) by mouth every 12 (twelve) hours. 02/16/23   Immordino, Garnette, FNP    Family History Family History  Problem Relation Age of Onset   Diabetes Maternal Uncle     Social History Social History   Tobacco Use   Smoking status: Never   Smokeless tobacco: Never  Vaping Use   Vaping status: Never Used  Substance Use Topics   Alcohol use: Yes   Drug use: No     Allergies   Patient has no known allergies.   Review of Systems Review of Systems   Physical Exam Triage Vital Signs ED Triage Vitals  Encounter Vitals Group     BP 12/16/23 1103 (!) 143/92     Systolic BP Percentile --  Diastolic BP Percentile --      Pulse Rate 12/16/23 1103 98     Resp 12/16/23 1103 18     Temp 12/16/23 1103 99.3 F (37.4 C)     Temp src --      SpO2 12/16/23 1103 97 %     Weight --      Height --      Head Circumference --      Peak Flow --      Pain Score 12/16/23 1100 7     Pain Loc --      Pain Education --      Exclude from Growth Chart --    No data found.  Updated Vital Signs BP (!) 143/92 (BP Location: Left Arm)   Pulse 98   Temp 99.3 F (37.4 C)   Resp 18   SpO2 97%   Visual Acuity Right Eye Distance:   Left Eye Distance:   Bilateral Distance:    Right Eye Near:   Left Eye Near:    Bilateral Near:     Physical Exam Constitutional:      Appearance: Normal appearance.  HENT:     Head: Normocephalic.     Right Ear: Tympanic membrane, ear canal and external ear normal.     Left Ear: Tympanic membrane, ear canal and external  ear normal.     Nose: Congestion and rhinorrhea present.     Mouth/Throat:     Pharynx: No oropharyngeal exudate or posterior oropharyngeal erythema.  Eyes:     Extraocular Movements: Extraocular movements intact.  Cardiovascular:     Rate and Rhythm: Normal rate and regular rhythm.     Pulses: Normal pulses.     Heart sounds: Normal heart sounds.  Pulmonary:     Effort: Pulmonary effort is normal.     Breath sounds: Normal breath sounds.  Musculoskeletal:     Cervical back: Normal range of motion.  Lymphadenopathy:     Cervical: Cervical adenopathy present.  Skin:    General: Skin is warm and dry.  Neurological:     Mental Status: She is alert and oriented to person, place, and time. Mental status is at baseline.      UC Treatments / Results  Labs (all labs ordered are listed, but only abnormal results are displayed) Labs Reviewed  POC COVID19/FLU A&B COMBO    EKG   Radiology No results found.  Procedures Procedures (including critical care time)  Medications Ordered in UC Medications - No data to display  Initial Impression / Assessment and Plan / UC Course  I have reviewed the triage vital signs and the nursing notes.  Pertinent labs & imaging results that were available during my care of the patient were reviewed by me and considered in my medical decision making (see chart for details).  Viral URI  Patient is in no signs of distress nor toxic appearing.  Vital signs are stable.  Low suspicion for pneumonia, pneumothorax or bronchitis and therefore will defer imaging. Covid and  Flu test negative.  May use additional over-the-counter medications as needed for supportive care.  May follow-up with urgent care as needed if symptoms persist or worsen.  Note given.   Final Clinical Impressions(s) / UC Diagnoses   Final diagnoses:  Viral URI     Discharge Instructions      Your symptoms today are most likely being caused by a virus and should steadily  improve in time it can take up to  7 to 10 days before you truly start to see a turnaround however things will get better  Covid and flu test negative    You can take Tylenol  and/or Ibuprofen  as needed for fever reduction and pain relief.   For cough: honey 1/2 to 1 teaspoon (you can dilute the honey in water or another fluid).  You can also use guaifenesin and dextromethorphan for cough. You can use a humidifier for chest congestion and cough.  If you don't have a humidifier, you can sit in the bathroom with the hot shower running.      For sore throat: try warm salt water gargles, cepacol lozenges, throat spray, warm tea or water with lemon/honey, popsicles or ice, or OTC cold relief medicine for throat discomfort.   For congestion: take a daily anti-histamine like Zyrtec, Claritin, and a oral decongestant, such as pseudoephedrine.  You can also use Flonase  1-2 sprays in each nostril daily.   It is important to stay hydrated: drink plenty of fluids (water, gatorade/powerade/pedialyte, juices, or teas) to keep your throat moisturized and help further relieve irritation/discomfort.    ED Prescriptions   None    PDMP not reviewed this encounter.   Teresa Shelba SAUNDERS, NP 12/16/23 1158

## 2023-12-16 NOTE — ED Triage Notes (Signed)
 Left ear "stopped up" "can't hear" x 2-3 days. States feels blocked. Today she woke up with sore throat, subjective fever, chills. Requesting covid/flu test

## 2024-01-18 ENCOUNTER — Ambulatory Visit: Payer: Self-pay | Admitting: Nurse Practitioner

## 2024-01-18 ENCOUNTER — Encounter: Payer: Self-pay | Admitting: Internal Medicine

## 2024-01-18 NOTE — Progress Notes (Deleted)
   There were no vitals taken for this visit.   Subjective:    Patient ID: Lauren Mercer, female    DOB: 05-13-1991, 33 y.o.   MRN: [id number]  HPI: Lauren Mercer is a 33 y.o. female  No chief complaint on file.   Discussed the use of AI scribe software for clinical note transcription with the patient, who gave verbal consent to proceed.  History of Present Illness           04/27/2022    8:37 AM 12/11/2021   10:52 AM 01/21/2020   10:35 AM  Depression screen PHQ 2/9  Decreased Interest 2 1 0  Down, Depressed, Hopeless 2 2 0  PHQ - 2 Score 4 3 0  Altered sleeping 1 0 0  Tired, decreased energy 1 1 0  Change in appetite 0 0 0  Feeling bad or failure about yourself  0 0 0  Trouble concentrating 0 0 0  Moving slowly or fidgety/restless 0 0 0  Suicidal thoughts 0 0 0  PHQ-9 Score 6 4 0  Difficult doing work/chores Not difficult at all Not difficult at all Not difficult at all    Relevant past medical, surgical, family and social history reviewed and updated as indicated. Interim medical history since our last visit reviewed. Allergies and medications reviewed and updated.  Review of Systems  Per HPI unless specifically indicated above     Objective:    There were no vitals taken for this visit.  {Vitals History (Optional):23777} Wt Readings from Last 3 Encounters:  10/08/22 200 lb (90.7 kg)  04/27/22 226 lb 8 oz (102.7 kg)  12/11/21 219 lb 4.8 oz (99.5 kg)    Physical Exam  Results for orders placed or performed during the hospital encounter of 12/16/23  POC Covid19/Flu A&B Antigen   Collection Time: 12/16/23 11:12 AM  Result Value Ref Range   Influenza A Antigen, POC Negative Negative   Influenza B Antigen, POC Negative Negative   Covid Antigen, POC Negative Negative   {Labs (Optional):23779}    Assessment & Plan:   Problem List Items Addressed This Visit   None    Assessment and Plan             Follow up plan: No follow-ups on  file.

## 2024-01-19 ENCOUNTER — Telehealth: Payer: Self-pay | Admitting: Internal Medicine

## 2024-01-19 NOTE — Telephone Encounter (Signed)
 Copied from CRM [id number]. Topic: General - Inquiry >> Jan 19, 2024  8:36 AM Leonette SQUIBB wrote: Reason for CRM: pt needs a note of proof that she came to her appt yesterday.  She did not get seen because of her medicaid.  Can someone give her mother a note for her.  Please call patient and let her know when it is ready [phone].  This is for her employer.

## 2024-01-19 NOTE — Telephone Encounter (Signed)
 done

## 2024-02-03 ENCOUNTER — Emergency Department
Admission: EM | Admit: 2024-02-03 | Discharge: 2024-02-03 | Disposition: A | Discharge status: Home / Self Care | Payer: Self-pay | Attending: Emergency Medicine | Admitting: Emergency Medicine

## 2024-02-03 ENCOUNTER — Other Ambulatory Visit: Payer: Self-pay

## 2024-02-03 DIAGNOSIS — W268XXA Contact with other sharp object(s), not elsewhere classified, initial encounter: Secondary | ICD-10-CM | POA: Insufficient documentation

## 2024-02-03 DIAGNOSIS — S61215A Laceration without foreign body of left ring finger without damage to nail, initial encounter: Secondary | ICD-10-CM | POA: Insufficient documentation

## 2024-02-03 DIAGNOSIS — Z23 Encounter for immunization: Secondary | ICD-10-CM | POA: Insufficient documentation

## 2024-02-03 DIAGNOSIS — Z79899 Other long term (current) drug therapy: Secondary | ICD-10-CM | POA: Insufficient documentation

## 2024-02-03 MED ORDER — TETANUS-DIPHTH-ACELL PERTUSSIS 5-2.5-18.5 LF-MCG/0.5 IM SUSY
0.5000 mL | PREFILLED_SYRINGE | Freq: Once | INTRAMUSCULAR | Status: AC
  Administered 2024-02-03: 0.5 mL via INTRAMUSCULAR

## 2024-02-03 NOTE — ED Triage Notes (Signed)
Pt reports she got cut on metal to the 4th digit left hand. Active bleeding, dressing applied. Pt unsure when her  last T-dap was. Pt talks in complete sentences no respiratory distress noted

## 2024-02-03 NOTE — ED Notes (Signed)
Pt given gauze pack

## 2024-02-03 NOTE — ED Provider Notes (Signed)
[place] [facility] DEPARTMENT AT [facility] Provider Note   CSN: [id number] Arrival date & time: 02/03/24  2051     History  Chief Complaint  Patient presents with   Finger Injury    Lauren Mercer is a 33 y.o. female.  Patient here for finger injury.  Notes left ring finger tip injury from a water heater earlier today.  No numbness or weakness.  Unable to stop bleeding prior to arrival.  Unsure of last tetanus.        Home Medications Prior to Admission medications   Medication Sig Start Date End Date Taking? Authorizing Provider  buPROPion (WELLBUTRIN XL) 150 MG 24 hr tablet Take 1 tablet (150 mg total) by mouth daily. 02/08/22   Margarita Mail, DO  escitalopram (LEXAPRO) 5 MG tablet Take 1 tablet (5 mg total) by mouth daily. KEEP SCHEDULED APPOINTMENT FOR ADDITIONAL REFILLS 02/08/22   Margarita Mail, DO  levonorgestrel [facility]) 20 MCG/24HR IUD 1 each by Intrauterine route once.    [provider]  lidocaine (XYLOCAINE) 2 % solution Use as directed 15 mLs in the mouth or throat every 3 (three) hours as needed for mouth pain (swish and spit). 10/08/22   Shirlee Latch, PA-C  mupirocin ointment (BACTROBAN) 2 % Apply 1 Application topically 2 (two) times daily. 05/13/23   Mickie Bail, NP  ondansetron (ZOFRAN-ODT) 4 MG disintegrating tablet Take 1 tablet (4 mg total) by mouth every 8 (eight) hours as needed for nausea or vomiting. 01/20/23   Immordino, Jeannett Senior, FNP  oseltamivir (TAMIFLU) 75 MG capsule Take 1 capsule (75 mg total) by mouth every 12 (twelve) hours. 02/16/23   Immordino, Jeannett Senior, FNP      Allergies    Patient has no known allergies.    Review of Systems   Review of Systems  Constitutional:  Negative for chills and fever.  Cardiovascular:  Negative for chest pain.  Gastrointestinal:  Negative for abdominal pain.  Skin:  Positive for wound.  Neurological:  Negative for weakness and numbness.    Physical Exam Updated Vital Signs BP  120/73 (BP Location: Left Arm)   Pulse (!) 110   Temp 98.5 F (36.9 C) (Oral)   Resp 16   Ht 5\' 4"  (1.626 m)   Wt 95.3 kg   BMI 36.05 kg/m  Physical Exam Constitutional:      Appearance: Normal appearance.  HENT:     Head: Normocephalic and atraumatic.  Eyes:     Pupils: Pupils are equal, round, and reactive to light.  Cardiovascular:     Rate and Rhythm: Normal rate and regular rhythm.  Musculoskeletal:        General: No swelling.  Skin:    Comments: 1 cm laceration distal pad of left ring finger.  Neurological:     Mental Status: She is alert.     ED Results / Procedures / Treatments   Labs (all labs ordered are listed, but only abnormal results are displayed) Labs Reviewed - No data to display  EKG None  Radiology No results found.  Procedures .Laceration Repair  Date/Time: 02/03/2024 9:47 PM  Performed by: Christen Bame, PA-C Authorized by: Christen Bame, PA-C   Consent:    Consent obtained:  Verbal   Consent given by:  Patient   Risks, benefits, and alternatives were discussed: yes     Risks discussed:  Infection   Alternatives discussed:  No treatment Universal protocol:    Procedure explained and questions answered to patient or  proxy's satisfaction: yes     Site/side marked: yes     Immediately prior to procedure, a time out was called: yes     Patient identity confirmed:  Verbally with patient Anesthesia:    Anesthesia method:  None Laceration details:    Location: left 4th finger.   Length (cm):  1 Exploration:    Hemostasis achieved with:  Direct pressure   Wound extent: areolar tissue not violated and no nerve damage     Contaminated: no   Treatment:    Area cleansed with:  Soap and water   Amount of cleaning:  Extensive   Visualized foreign bodies/material removed: no     Debridement:  None   Undermining:  None   Scar revision: no   Skin repair:    Repair method:  Tissue adhesive Approximation:    Approximation:  Loose Repair type:     Repair type:  Simple Post-procedure details:    Dressing:  Open (no dressing)   Procedure completion:  Tolerated     Medications Ordered in ED Medications  Tdap (BOOSTRIX) injection 0.5 mL (0.5 mLs Intramuscular Given 02/03/24 2143)    ED Course/ Medical Decision Making/ A&P                                 Medical Decision Making Patient here for small superficial laceration pad of left ring finger.  Cleansed with soap and water extensively tolerated closure with tissue adhesive.  Recommend wound aftercare.  Follow-up with primary care.  Given return precautions.  Risk Prescription drug management.           Final Clinical Impression(s) / ED Diagnoses Final diagnoses:  Laceration of left ring finger without foreign body without damage to nail, initial encounter    Rx / DC Orders ED Discharge Orders     None         Christen Bame, Cordelia Poche 02/03/24 2148    Jene Every, MD 02/03/24 2248

## 2024-04-20 ENCOUNTER — Other Ambulatory Visit: Payer: Self-pay

## 2024-04-20 ENCOUNTER — Emergency Department
Admission: EM | Admit: 2024-04-20 | Discharge: 2024-04-20 | Disposition: A | Discharge status: Home / Self Care | Payer: Self-pay | Attending: Emergency Medicine | Admitting: Emergency Medicine

## 2024-04-20 DIAGNOSIS — R1013 Epigastric pain: Secondary | ICD-10-CM | POA: Insufficient documentation

## 2024-04-20 DIAGNOSIS — A084 Viral intestinal infection, unspecified: Secondary | ICD-10-CM

## 2024-04-20 DIAGNOSIS — D72829 Elevated white blood cell count, unspecified: Secondary | ICD-10-CM | POA: Insufficient documentation

## 2024-04-20 DIAGNOSIS — R197 Diarrhea, unspecified: Secondary | ICD-10-CM | POA: Insufficient documentation

## 2024-04-20 DIAGNOSIS — R112 Nausea with vomiting, unspecified: Secondary | ICD-10-CM | POA: Insufficient documentation

## 2024-04-20 DIAGNOSIS — J45909 Unspecified asthma, uncomplicated: Secondary | ICD-10-CM | POA: Insufficient documentation

## 2024-04-20 LAB — COMPREHENSIVE METABOLIC PANEL WITH GFR
ALT: 17 U/L (ref 0–44)
AST: 20 U/L (ref 15–41)
Albumin: 3.9 g/dL (ref 3.5–5.0)
Alkaline Phosphatase: 64 U/L (ref 38–126)
Anion gap: 9 (ref 5–15)
BUN: 15 mg/dL (ref 6–20)
CO2: 19 mmol/L — ABNORMAL LOW (ref 22–32)
Calcium: 8.4 mg/dL — ABNORMAL LOW (ref 8.9–10.3)
Chloride: 104 mmol/L (ref 98–111)
Creatinine, Ser: 0.67 mg/dL (ref 0.44–1.00)
GFR, Estimated: >60 mL/min (ref >60)
Glucose, Bld: 120 mg/dL — ABNORMAL HIGH (ref 70–99)
Potassium: 3.7 mmol/L (ref 3.5–5.1)
Sodium: 132 mmol/L — ABNORMAL LOW (ref 135–145)
Total Bilirubin: 0.7 mg/dL (ref 0.0–1.2)
Total Protein: 7.5 g/dL (ref 6.5–8.1)

## 2024-04-20 LAB — CBC
HCT: 40.7 % (ref 36.0–46.0)
Hemoglobin: 13.5 g/dL (ref 12.0–15.0)
MCH: 29.3 pg (ref 26.0–34.0)
MCHC: 33.2 g/dL (ref 30.0–36.0)
MCV: 88.5 fL (ref 80.0–100.0)
Platelets: 327 10*3/uL (ref 150–400)
RBC: 4.6 MIL/uL (ref 3.87–5.11)
RDW: 12.4 % (ref 11.5–15.5)
WBC: 18 10*3/uL — ABNORMAL HIGH (ref 4.0–10.5)
nRBC: 0 % (ref 0.0–0.2)

## 2024-04-20 LAB — HCG, QUANTITATIVE, PREGNANCY: hCG, Beta Chain, Quant, S: <1 m[IU]/mL (ref <5)

## 2024-04-20 LAB — LIPASE, BLOOD: Lipase: 26 U/L (ref 11–51)

## 2024-04-20 MED ORDER — LACTATED RINGERS IV BOLUS
1000.0000 mL | Freq: Once | INTRAVENOUS | Status: AC
Start: 2024-04-20 — End: 2024-04-20
  Administered 2024-04-20: 1000 mL via INTRAVENOUS

## 2024-04-20 MED ORDER — ONDANSETRON 4 MG PO TBDP: 4.0000 mg | ORAL_TABLET | Freq: Three times a day (TID) | ORAL | 0 refills | Status: AC | PRN

## 2024-04-20 MED ORDER — ONDANSETRON HCL 4 MG/2ML IJ SOLN
4.0000 mg | Freq: Once | INTRAMUSCULAR | Status: AC | PRN
  Administered 2024-04-20: 4 mg via INTRAVENOUS
  Filled 2024-04-20: qty 2

## 2024-04-20 NOTE — ED Notes (Signed)
 See triage note  Presents with abd pain  States pain started about 5 am  Pos n/v  low grade temp on arrival

## 2024-04-20 NOTE — ED Notes (Signed)
 Pt unable to urinate at this time, given labeled UA cup and instructed on use for sample.

## 2024-04-20 NOTE — ED Triage Notes (Signed)
 Pt c/o abdominal pain since 5 am today and pt throwing up x10 today. Pt AOX4, NAD noted.

## 2024-04-20 NOTE — ED Notes (Signed)
 Resting at present   No vomiting since admission to ED

## 2024-04-20 NOTE — Discharge Instructions (Addendum)
 Please make sure to keep yourself hydrated, go slow and start with Gatorade, soups, Pedialyte.

## 2024-04-20 NOTE — ED Notes (Signed)
 Pt throwing up in triage room

## 2024-04-20 NOTE — ED Provider Notes (Signed)
 [facility] Provider Note    Event Date/Time   First MD Initiated Contact with Patient 04/20/24 1633     (approximate)   History   Abdominal Pain and Emesis   HPI  Lauren Mercer is a 33 y.o. female with history of asthma, presenting with nausea vomiting and diarrhea today.  Patient states that she was not able to tolerate p.o., vomited 10 times.  Had mild epigastric abdominal pain.  No urinary symptoms, no vaginal discharge or bleeding, no fever.  No chest pain or shortness of breath or cough.  On independent review, she is on Wellbutrin , Lexapro  for her medications.  Seen by urgent care in January for congestion, rhinorrhea, diagnosed with viral URI.  She does not have any of those symptoms today.     Physical Exam   Triage Vital Signs: ED Triage Vitals  Encounter Vitals Group     BP 04/20/24 1550 113/67     Systolic BP Percentile --      Diastolic BP Percentile --      Pulse Rate 04/20/24 1550 99     Resp 04/20/24 1550 (!) 23     Temp 04/20/24 1550 99 F (37.2 C)     Temp Source 04/20/24 1550 Oral     SpO2 04/20/24 1550 95 %     Weight 04/20/24 1553 200 lb (90.7 kg)     Height 04/20/24 1553 5\' 3"  (1.6 m)     Head Circumference --      Peak Flow --      Pain Score 04/20/24 1553 10     Pain Loc --      Pain Education --      Exclude from Growth Chart --     Most recent vital signs: Vitals:   04/20/24 1550  BP: 113/67  Pulse: 99  Resp: (!) 23  Temp: 99 F (37.2 C)  SpO2: 95%     General: Awake, no distress.  CV:  Good peripheral perfusion.  Resp:  Normal effort.  Abd:  No distention.  Soft nontender Other:  No CVA tenderness, nontoxic-appearing   ED Results / Procedures / Treatments   Labs (all labs ordered are listed, but only abnormal results are displayed) Labs Reviewed  COMPREHENSIVE METABOLIC PANEL WITH GFR - Abnormal; Notable for the following components:      Result Value   Sodium 132 (*)    CO2 19 (*)     Glucose, Bld 120 (*)    Calcium 8.4 (*)    All other components within normal limits  CBC - Abnormal; Notable for the following components:   WBC 18.0 (*)    All other components within normal limits  LIPASE, BLOOD  HCG, QUANTITATIVE, PREGNANCY      PROCEDURES:  Critical Care performed: No  Procedures   MEDICATIONS ORDERED IN ED: Medications  ondansetron  (ZOFRAN ) injection 4 mg (4 mg Intravenous Given 04/20/24 1607)  lactated ringers  bolus 1,000 mL (1,000 mLs Intravenous New Bag/Given 04/20/24 1709)     IMPRESSION / MDM / ASSESSMENT AND PLAN / ED COURSE  I reviewed the triage vital signs and the nursing notes.                              Differential diagnosis includes, but is not limited to, gastroenteritis, food poisoning, viral illness, norovirus, electrolyte derangement, dehydration.  Will give her some Zofran , IV fluids, labs.  No  indication for imaging at this time given that her abdomen is soft nontender.   Patient's presentation is most consistent with acute presentation with potential threat to life or bodily function.  Independent interpretation of labs below.  On reassessment patient's symptoms are improving, tolerating p.o.  Consider medication for inpatient admission at this time, she is safe for outpatient management.  Will discharge with strict precautions.  Will also give her a prescription for Zofran .  Encouraged hydration.  Instructed her to follow-up with a primary care doctor for further management of her symptoms.  Discharged with strict return precautions.  Clinical Course as of 04/20/24 1847  Fri Apr 20, 2024  1714 Independent review of labs, she has a leukocytosis, electrolytes not severely deranged, LFTs are normal, lipase is normal. [TT]    Clinical Course User Index [TT] Drenda Gentle Richard Champion, MD     FINAL CLINICAL IMPRESSION(S) / ED DIAGNOSES   Final diagnoses:  Nausea vomiting and diarrhea  Epigastric pain     Rx / DC Orders   ED Discharge  Orders          Ordered    ondansetron  (ZOFRAN -ODT) 4 MG disintegrating tablet  Every 8 hours PRN        04/20/24 1822             Note:  This document was prepared using Dragon voice recognition software and may include unintentional dictation errors.    Shane Darling, MD 04/20/24 [phone]

## 2024-12-10 ENCOUNTER — Other Ambulatory Visit: Payer: Self-pay

## 2024-12-10 ENCOUNTER — Emergency Department
Admission: EM | Admit: 2024-12-10 | Discharge: 2024-12-10 | Disposition: A | Discharge status: Home / Self Care | Payer: Self-pay | Attending: Emergency Medicine | Admitting: Emergency Medicine

## 2024-12-10 DIAGNOSIS — J45909 Unspecified asthma, uncomplicated: Secondary | ICD-10-CM | POA: Insufficient documentation

## 2024-12-10 DIAGNOSIS — J111 Influenza due to unidentified influenza virus with other respiratory manifestations: Secondary | ICD-10-CM | POA: Insufficient documentation

## 2024-12-10 MED ORDER — OSELTAMIVIR PHOSPHATE 75 MG PO CAPS: 75.0000 mg | ORAL_CAPSULE | Freq: Two times a day (BID) | ORAL | 0 refills | Status: AC

## 2024-12-10 NOTE — ED Provider Notes (Signed)
 "  [facility] Provider Note    Event Date/Time   First MD Initiated Contact with Patient 12/10/24 1743     (approximate)   History   flu like symptoms   HPI  Lauren Mercer is a 33 y.o. female with history of asthma presents emergency department complaint of flulike symptoms.  States they were exposed to the flu.  Symptoms started this morning.  No vomiting or diarrhea.  No chest pain or shortness of breath.  Just runny nose, congestion and cough      Physical Exam   Triage Vital Signs: ED Triage Vitals  Encounter Vitals Group     BP 12/10/24 1606 (!) 109/59     Girls Systolic BP Percentile --      Girls Diastolic BP Percentile --      Boys Systolic BP Percentile --      Boys Diastolic BP Percentile --      Pulse Rate 12/10/24 1606 (!) 113     Resp 12/10/24 1606 18     Temp 12/10/24 1606 99.4 F (37.4 C)     Temp src --      SpO2 12/10/24 1606 100 %     Weight 12/10/24 1804 199 lb 15.3 oz (90.7 kg)     Height 12/10/24 1804 5' 3 (1.6 m)     Head Circumference --      Peak Flow --      Pain Score 12/10/24 1605 6     Pain Loc --      Pain Education --      Exclude from Growth Chart --     Most recent vital signs: Vitals:   12/10/24 1606  BP: (!) 109/59  Pulse: (!) 113  Resp: 18  Temp: 99.4 F (37.4 C)  SpO2: 100%     General: Awake, no distress.   CV:  Good peripheral perfusion. regular rate and  rhythm Resp:  Normal effort. Lungs CTA Abd:  No distention.   Other:      ED Results / Procedures / Treatments   Labs (all labs ordered are listed, but only abnormal results are displayed) Labs Reviewed - No data to display   EKG     RADIOLOGY     PROCEDURES:   Procedures  Critical Care:  no Chief Complaint  Patient presents with   flu like symptoms      MEDICATIONS ORDERED IN ED: Medications - No data to display   IMPRESSION / MDM / ASSESSMENT AND PLAN / ED COURSE  I reviewed the triage vital signs  and the nursing notes.                              Differential diagnosis includes, but is not limited to, COVID, influenza, RSV, viral URI  Patient's presentation is most consistent with acute illness / injury with system symptoms.   Since patient was exposed to the flu, consider this positive flu.  Hospitalist added flu test however patient has all symptoms of influenza.  At this time we can place her on Tamiflu  at her request.  Tylenol /ibuprofen  for fever if needed.  Work note provided.  Discharged stable condition.  Instructions to return if worsening      FINAL CLINICAL IMPRESSION(S) / ED DIAGNOSES   Final diagnoses:  Influenza     Rx / DC Orders   ED Discharge Orders  Ordered    oseltamivir  (TAMIFLU ) 75 MG capsule  2 times daily        12/10/24 1809             Note:  This document was prepared using Dragon voice recognition software and may include unintentional dictation errors.    Gasper Devere ORN, PA-C 12/10/24 1814    Claudene Rover, MD 12/10/24 2208  "

## 2024-12-10 NOTE — ED Notes (Signed)
See triage note  Presents with low grade temp and body aches

## 2024-12-10 NOTE — ED Triage Notes (Signed)
 Pt comes with c/o flu like symptoms. Pt state this started this morning.
